# Patient Record
Sex: Female | Born: 1975 | Race: White | Hispanic: No | Marital: Married | Smoking: Never smoker
Health system: Southern US, Community
[De-identification: ages and names within clinical notes are randomized; demographics above are authoritative.]

## PROBLEM LIST (undated history)

## (undated) DIAGNOSIS — R112 Nausea with vomiting, unspecified: Secondary | ICD-10-CM

## (undated) DIAGNOSIS — T7840XA Allergy, unspecified, initial encounter: Secondary | ICD-10-CM

## (undated) DIAGNOSIS — D219 Benign neoplasm of connective and other soft tissue, unspecified: Secondary | ICD-10-CM

## (undated) DIAGNOSIS — N83209 Unspecified ovarian cyst, unspecified side: Secondary | ICD-10-CM

## (undated) DIAGNOSIS — Z87442 Personal history of urinary calculi: Secondary | ICD-10-CM

## (undated) DIAGNOSIS — Z9889 Other specified postprocedural states: Secondary | ICD-10-CM

## (undated) DIAGNOSIS — E785 Hyperlipidemia, unspecified: Secondary | ICD-10-CM

## (undated) DIAGNOSIS — Z8489 Family history of other specified conditions: Secondary | ICD-10-CM

## (undated) DIAGNOSIS — I10 Essential (primary) hypertension: Secondary | ICD-10-CM

## (undated) HISTORY — DX: Allergy, unspecified, initial encounter: T78.40XA

## (undated) HISTORY — PX: HERNIA REPAIR: SHX51

---

## 1997-09-21 ENCOUNTER — Ambulatory Visit (HOSPITAL_COMMUNITY): Admission: RE | Admit: 1997-09-21 | Discharge: 1997-09-21 | Payer: Self-pay | Admitting: Family Medicine

## 1997-12-07 ENCOUNTER — Ambulatory Visit (HOSPITAL_COMMUNITY): Admission: RE | Admit: 1997-12-07 | Discharge: 1997-12-07 | Payer: Self-pay | Admitting: Family Medicine

## 1998-03-07 ENCOUNTER — Inpatient Hospital Stay (HOSPITAL_COMMUNITY): Admission: AD | Admit: 1998-03-07 | Discharge: 1998-03-07 | Payer: Self-pay | Admitting: Family Medicine

## 1998-04-05 ENCOUNTER — Encounter (HOSPITAL_COMMUNITY): Admission: AD | Admit: 1998-04-05 | Discharge: 1998-04-18 | Payer: Self-pay | Admitting: Family Medicine

## 1998-04-16 HISTORY — PX: WISDOM TOOTH EXTRACTION: SHX21

## 1998-04-17 ENCOUNTER — Inpatient Hospital Stay (HOSPITAL_COMMUNITY): Admission: AD | Admit: 1998-04-17 | Discharge: 1998-04-19 | Payer: Self-pay | Admitting: Family Medicine

## 1998-04-29 ENCOUNTER — Inpatient Hospital Stay (HOSPITAL_COMMUNITY): Admission: AD | Admit: 1998-04-29 | Discharge: 1998-04-29 | Payer: Self-pay | Admitting: Family Medicine

## 1998-07-12 ENCOUNTER — Other Ambulatory Visit: Admission: RE | Admit: 1998-07-12 | Discharge: 1998-07-12 | Payer: Self-pay | Admitting: Obstetrics & Gynecology

## 1999-07-31 ENCOUNTER — Other Ambulatory Visit: Admission: RE | Admit: 1999-07-31 | Discharge: 1999-07-31 | Payer: Self-pay | Admitting: Obstetrics & Gynecology

## 2000-11-11 ENCOUNTER — Other Ambulatory Visit: Admission: RE | Admit: 2000-11-11 | Discharge: 2000-11-11 | Payer: Self-pay | Admitting: Obstetrics & Gynecology

## 2001-11-19 ENCOUNTER — Other Ambulatory Visit: Admission: RE | Admit: 2001-11-19 | Discharge: 2001-11-19 | Payer: Self-pay | Admitting: Obstetrics & Gynecology

## 2002-06-12 ENCOUNTER — Inpatient Hospital Stay (HOSPITAL_COMMUNITY): Admission: AD | Admit: 2002-06-12 | Discharge: 2002-06-14 | Payer: Self-pay | Admitting: Obstetrics and Gynecology

## 2002-07-20 ENCOUNTER — Other Ambulatory Visit: Admission: RE | Admit: 2002-07-20 | Discharge: 2002-07-20 | Payer: Self-pay | Admitting: Obstetrics & Gynecology

## 2003-08-18 ENCOUNTER — Other Ambulatory Visit: Admission: RE | Admit: 2003-08-18 | Discharge: 2003-08-18 | Payer: Self-pay | Admitting: Obstetrics & Gynecology

## 2004-10-02 ENCOUNTER — Other Ambulatory Visit: Admission: RE | Admit: 2004-10-02 | Discharge: 2004-10-02 | Payer: Self-pay | Admitting: Obstetrics & Gynecology

## 2016-02-07 ENCOUNTER — Emergency Department (HOSPITAL_COMMUNITY): Payer: BC Managed Care – PPO

## 2016-02-07 ENCOUNTER — Emergency Department (HOSPITAL_COMMUNITY)
Admission: EM | Admit: 2016-02-07 | Discharge: 2016-02-07 | Disposition: A | Discharge status: Home / Self Care | Payer: BC Managed Care – PPO | Attending: Emergency Medicine | Admitting: Emergency Medicine

## 2016-02-07 ENCOUNTER — Encounter (HOSPITAL_COMMUNITY): Payer: Self-pay | Admitting: Emergency Medicine

## 2016-02-07 DIAGNOSIS — R1031 Right lower quadrant pain: Secondary | ICD-10-CM

## 2016-02-07 DIAGNOSIS — N83201 Unspecified ovarian cyst, right side: Secondary | ICD-10-CM | POA: Diagnosis not present

## 2016-02-07 LAB — URINALYSIS, ROUTINE W REFLEX MICROSCOPIC
Bilirubin Urine: NEGATIVE
Glucose, UA: NEGATIVE mg/dL
Ketones, ur: NEGATIVE mg/dL
Nitrite: NEGATIVE
Protein, ur: 30 mg/dL — AB
Specific Gravity, Urine: 1.013 (ref 1.005–1.030)
pH: 6 (ref 5.0–8.0)

## 2016-02-07 LAB — CBC WITH DIFFERENTIAL/PLATELET
Basophils Absolute: 0 K/uL (ref 0.0–0.1)
Basophils Relative: 0 %
Eosinophils Absolute: 0.1 K/uL (ref 0.0–0.7)
Eosinophils Relative: 1 %
HCT: 36.1 % (ref 36.0–46.0)
Hemoglobin: 10.9 g/dL — ABNORMAL LOW (ref 12.0–15.0)
Lymphocytes Relative: 5 %
Lymphs Abs: 0.9 K/uL (ref 0.7–4.0)
MCH: 22.7 pg — ABNORMAL LOW (ref 26.0–34.0)
MCHC: 30.2 g/dL (ref 30.0–36.0)
MCV: 75.1 fL — ABNORMAL LOW (ref 78.0–100.0)
Monocytes Absolute: 0.8 K/uL (ref 0.1–1.0)
Monocytes Relative: 5 %
Neutro Abs: 15.7 K/uL — ABNORMAL HIGH (ref 1.7–7.7)
Neutrophils Relative %: 89 %
Platelets: 329 K/uL (ref 150–400)
RBC: 4.81 MIL/uL (ref 3.87–5.11)
RDW: 15.5 % (ref 11.5–15.5)
WBC: 17.7 K/uL — ABNORMAL HIGH (ref 4.0–10.5)

## 2016-02-07 LAB — URINE MICROSCOPIC-ADD ON

## 2016-02-07 LAB — COMPREHENSIVE METABOLIC PANEL WITH GFR
ALT: 12 U/L — ABNORMAL LOW (ref 14–54)
AST: 17 U/L (ref 15–41)
Albumin: 3.8 g/dL (ref 3.5–5.0)
Alkaline Phosphatase: 84 U/L (ref 38–126)
Anion gap: 10 (ref 5–15)
BUN: 12 mg/dL (ref 6–20)
CO2: 22 mmol/L (ref 22–32)
Calcium: 9 mg/dL (ref 8.9–10.3)
Chloride: 105 mmol/L (ref 101–111)
Creatinine, Ser: 0.75 mg/dL (ref 0.44–1.00)
GFR calc Af Amer: >60 mL/min
GFR calc non Af Amer: >60 mL/min
Glucose, Bld: 102 mg/dL — ABNORMAL HIGH (ref 65–99)
Potassium: 4 mmol/L (ref 3.5–5.1)
Sodium: 137 mmol/L (ref 135–145)
Total Bilirubin: 0.5 mg/dL (ref 0.3–1.2)
Total Protein: 6.8 g/dL (ref 6.5–8.1)

## 2016-02-07 LAB — LIPASE, BLOOD: Lipase: 31 U/L (ref 11–51)

## 2016-02-07 LAB — I-STAT BETA HCG BLOOD, ED (MC, WL, AP ONLY): I-stat hCG, quantitative: <5 m[IU]/mL

## 2016-02-07 MED ORDER — KETOROLAC TROMETHAMINE 30 MG/ML IJ SOLN
30.0000 mg | Freq: Once | INTRAMUSCULAR | Status: AC
  Administered 2016-02-07: 30 mg via INTRAVENOUS
  Filled 2016-02-07: qty 1

## 2016-02-07 MED ORDER — NAPROXEN 500 MG PO TABS: 500.0000 mg | ORAL_TABLET | Freq: Two times a day (BID) | ORAL | 0 refills | Status: DC

## 2016-02-07 MED ORDER — IOPAMIDOL (ISOVUE-300) INJECTION 61%
INTRAVENOUS | Status: AC
  Administered 2016-02-07: 100 mL
  Filled 2016-02-07: qty 100

## 2016-02-07 NOTE — ED Notes (Signed)
Patient transported to CT 

## 2016-02-07 NOTE — ED Triage Notes (Signed)
Pt states yesterday she had abdominal pain in middle of abdomen. Then today, abdominal pain became worse and is now on her Right side. Pt states she was nauseous yesterday. Denies vomiting. Pt states pain was 10/10 before ibuprofen. Pt took 2 ibuprofen at home. Pt states pain now 4 or 5 out of 10.

## 2016-02-07 NOTE — Discharge Instructions (Signed)
If your pain significantly worsens in the next 24 hours or if you develop fevers, chills, vomiting please return to the ED. Otherwise, please follow up with her OB/GYN. Take Naprosyn as needed for pain.

## 2016-02-07 NOTE — ED Provider Notes (Signed)
[facility] DEPT Provider Note   CSN: [id number] Arrival date & time: 02/07/16  [id number]     History   Chief Complaint Chief Complaint  Patient presents with  . Abdominal Pain    HPI Wanda Richardson is a 40 y.o. female with pmhx of obesity who presents to the ED today c/o abdominal pain. Pt states that yesterday she had [place] food for lunch. Approximately 2 hours later she developed a severe cramping sensation around her umbilicius. Pain did not radiate and lasted for about 20 minutes. Pt was then woken up from sleep around 4 am from pain that was now in her suprapubic are and RLQ. She took 2 ibuprofen which milkdly improved her pain. Pain is worsened with walking or any movent. She reports associated clamminess and nausea. Pt went to UC and was sent here to r/o appendicitis.  HPI  History reviewed. No pertinent past medical history.  There are no active problems to display for this patient.   History reviewed. No pertinent surgical history.  OB History    Gravida Para Term Preterm AB Living             2   SAB TAB Ectopic Multiple Live Births                   Home Medications    Prior to Admission medications   Not on File    Family History History reviewed. No pertinent family history.  Social History Social History  Substance Use Topics  . Smoking status: Never Smoker  . Smokeless tobacco: Never Used  . Alcohol use No     Allergies   Review of patient's allergies indicates no known allergies.   Review of Systems Review of Systems  All other systems reviewed and are negative.    Physical Exam Updated Vital Signs BP 146/85 (BP Location: Right Arm)   Pulse 71   Temp 98 F (36.7 C) (Oral)   Resp 16   Ht 5\' 5"  (1.651 m)   Wt 102.1 kg   LMP 02/06/2016 (Exact Date)   SpO2 99%   BMI 37.44 kg/m   Physical Exam  Constitutional: She is oriented to person, place, and time. She appears well-developed and well-nourished. No distress.  HENT:    Head: Normocephalic and atraumatic.  Mouth/Throat: No oropharyngeal exudate.  Eyes: Conjunctivae and EOM are normal. Pupils are equal, round, and reactive to light. Right eye exhibits no discharge. Left eye exhibits no discharge. No scleral icterus.  Cardiovascular: Normal rate, regular rhythm, normal heart sounds and intact distal pulses.  Exam reveals no gallop and no friction rub.   No murmur heard. Pulmonary/Chest: Effort normal and breath sounds normal. No respiratory distress. She has no wheezes. She has no rales. She exhibits no tenderness.  Abdominal: Soft. She exhibits no distension. There is tenderness ( RLQ). There is no guarding.  Negative Rosving's. No peritoneal signs  Musculoskeletal: Normal range of motion. She exhibits no edema.  Neurological: She is alert and oriented to person, place, and time.  Skin: Skin is warm and dry. No rash noted. She is not diaphoretic. No erythema. No pallor.  Psychiatric: She has a normal mood and affect. Her behavior is normal.  Nursing note and vitals reviewed.    ED Treatments / Results  Labs (all labs ordered are listed, but only abnormal results are displayed) Labs Reviewed  COMPREHENSIVE METABOLIC PANEL  CBC WITH DIFFERENTIAL/PLATELET  LIPASE, BLOOD  URINALYSIS, ROUTINE W REFLEX MICROSCOPIC (NOT AT  [facility])  I-STAT BETA HCG BLOOD, ED ([facility], [facility], [facility] ONLY)    EKG  EKG Interpretation None       Radiology US Transvaginal Non-ob  Result Date: 02/07/2016 CLINICAL DATA:  RIGHT upper quadrant pain. RIGHT ovarian cyst on CT. Evaluate for ovarian torsion. EXAM: TRANSABDOMINAL AND TRANSVAGINAL ULTRASOUND OF PELVIS DOPPLER ULTRASOUND OF OVARIES TECHNIQUE: Both transabdominal and transvaginal ultrasound examinations of the pelvis were performed. Transabdominal technique was performed for global imaging of the pelvis including uterus, ovaries, adnexal regions, and pelvic cul-de-sac. It was necessary to proceed with endovaginal exam following the  transabdominal exam to visualize the ovaries. Color and duplex Doppler ultrasound was utilized to evaluate blood flow to the ovaries. COMPARISON:  CT 02/07/2016 FINDINGS: Uterus Measurements: Normal in size at 12.2 x 6.5 x 7.0 cm. 4 cm leiomyoma within the uterine fundus. Endometrium Thickness: Thickened to 16.2 mm. Upper limits of normal for premenopausal female. Right ovary Measurements: LEFT ovary is enlarged to 7.1 x 6.2 x 6.5 cm. Complex cystic lesion measuring 6.5 x 3.6 x 7.0 cm within the RIGHT ovary with mild internal septations. This is poorly evaluated due to body habitus . Left ovary Measurements: 3.6 x 2.8 x 3.2 cm. Normal appearance / no adnexal mass. Pulsed Doppler evaluation of both ovaries demonstrates normal low-resistance arterial and venous waveforms. Other findings No abnormal free fluid. IMPRESSION: 1. No evidence of ovarian torsion. 2. Complex cystic ovarian lesion on the RIGHT. Recommend GYN consultation and follow-up ultrasound in 6 to 8 weeks. 3. Uterine fibroid. 4. Endometrium upper limits of normal. Recommend evaluation in 6-8 weeks with the RIGHT ovarian cyst. Electronically Signed   By: Suzy Bouchard M.D.   On: 02/07/2016 15:11   US Pelvis Complete  Result Date: 02/07/2016 CLINICAL DATA:  RIGHT upper quadrant pain. RIGHT ovarian cyst on CT. Evaluate for ovarian torsion. EXAM: TRANSABDOMINAL AND TRANSVAGINAL ULTRASOUND OF PELVIS DOPPLER ULTRASOUND OF OVARIES TECHNIQUE: Both transabdominal and transvaginal ultrasound examinations of the pelvis were performed. Transabdominal technique was performed for global imaging of the pelvis including uterus, ovaries, adnexal regions, and pelvic cul-de-sac. It was necessary to proceed with endovaginal exam following the transabdominal exam to visualize the ovaries. Color and duplex Doppler ultrasound was utilized to evaluate blood flow to the ovaries. COMPARISON:  CT 02/07/2016 FINDINGS: Uterus Measurements: Normal in size at 12.2 x 6.5 x 7.0  cm. 4 cm leiomyoma within the uterine fundus. Endometrium Thickness: Thickened to 16.2 mm. Upper limits of normal for premenopausal female. Right ovary Measurements: LEFT ovary is enlarged to 7.1 x 6.2 x 6.5 cm. Complex cystic lesion measuring 6.5 x 3.6 x 7.0 cm within the RIGHT ovary with mild internal septations. This is poorly evaluated due to body habitus . Left ovary Measurements: 3.6 x 2.8 x 3.2 cm. Normal appearance / no adnexal mass. Pulsed Doppler evaluation of both ovaries demonstrates normal low-resistance arterial and venous waveforms. Other findings No abnormal free fluid. IMPRESSION: 1. No evidence of ovarian torsion. 2. Complex cystic ovarian lesion on the RIGHT. Recommend GYN consultation and follow-up ultrasound in 6 to 8 weeks. 3. Uterine fibroid. 4. Endometrium upper limits of normal. Recommend evaluation in 6-8 weeks with the RIGHT ovarian cyst. Electronically Signed   By: Suzy Bouchard M.D.   On: 02/07/2016 15:11   Ct Abdomen Pelvis W Contrast  Result Date: 02/07/2016 CLINICAL DATA:  Right abdominal pain, nausea starting yesterday, right lower quadrant pain EXAM: CT ABDOMEN AND PELVIS WITH CONTRAST TECHNIQUE: Multidetector CT imaging of the abdomen and pelvis  was performed using the standard protocol following bolus administration of intravenous contrast. CONTRAST:  144mL ISOVUE-300 IOPAMIDOL (ISOVUE-300) INJECTION 61% COMPARISON:  None. FINDINGS: Lower chest: Lung bases are unremarkable. Hepatobiliary: Enhanced liver is unremarkable. No calcified gallstones are noted within gallbladder. Pancreas: Enhanced pancreas is unremarkable. Spleen: Enhanced spleen is unremarkable. Adrenals/Urinary Tract: No adrenal gland mass. Enhanced kidneys are symmetrical in size. No hydronephrosis or hydroureter. Delayed renal images shows bilateral renal symmetrical excretion. Bilateral visualized proximal ureter is unremarkable. The urinary bladder is unremarkable. Stomach/Bowel: Study is limited without  oral contrast. No gastric outlet obstruction. No small bowel obstruction. No pericecal inflammation. The appendix is not identified. No colitis or diverticulitis. No distal colonic obstruction. Vascular/Lymphatic: No aortic aneurysm. No retroperitoneal or mesenteric adenopathy. Reproductive: There is a right ovarian cyst with a tiny septation measures 8.1 x 5 cm. Correlation with GYN exam and follow-up pelvic ultrasound in 6-8 weeks is recommended to assure resolution There is a enhancing myometrial nodule in right fundus measures 3.8 cm highly suspicious for a fibroid. Correlation with pelvic ultrasound or MRI is recommended. The left ovary is unremarkable. Other: There is a umbilical hernia containing omental fat and small amount of fluid measures 3.7 cm. There is mild stranding of the mesenteric fat in right anterior mesentery at the base of the hernia measures about 1.7 cm. Focal panniculitis cannot be excluded. Clinical correlation is necessary. Musculoskeletal: No destructive bony lesions are noted. Sagittal images of the spine are unremarkable. IMPRESSION: 1. There is a right ovarian cyst with a thin septation measures 8.1 x 5 cm. Correlation with GYN exam and follow-up pelvic ultrasound in 6-8 weeks is recommended to assure resolution. Enhancing probable fibroid within right uterus fundus measures 3.8 cm. Correlation with pelvic ultrasound or MRI is recommended. 2. No pericecal inflammation.  Appendix is not identified. 3. No small bowel obstruction. There is a umbilical hernia containing omental fat and small amount of fluid measures 3.7 cm. There is mild stranding of the mesenteric fat in right anterior mesentery at the base of the hernia measures about 1.7 cm. Focal panniculitis cannot be excluded. Clinical correlation is necessary. Electronically Signed   By: Lahoma Crocker M.D.   On: 02/07/2016 11:37   [place] Art/ven Flow Abd Pelv Doppler  Result Date: 02/07/2016 CLINICAL DATA:  RIGHT upper quadrant pain.  RIGHT ovarian cyst on CT. Evaluate for ovarian torsion. EXAM: TRANSABDOMINAL AND TRANSVAGINAL ULTRASOUND OF PELVIS DOPPLER ULTRASOUND OF OVARIES TECHNIQUE: Both transabdominal and transvaginal ultrasound examinations of the pelvis were performed. Transabdominal technique was performed for global imaging of the pelvis including uterus, ovaries, adnexal regions, and pelvic cul-de-sac. It was necessary to proceed with endovaginal exam following the transabdominal exam to visualize the ovaries. Color and duplex Doppler ultrasound was utilized to evaluate blood flow to the ovaries. COMPARISON:  CT 02/07/2016 FINDINGS: Uterus Measurements: Normal in size at 12.2 x 6.5 x 7.0 cm. 4 cm leiomyoma within the uterine fundus. Endometrium Thickness: Thickened to 16.2 mm. Upper limits of normal for premenopausal female. Right ovary Measurements: LEFT ovary is enlarged to 7.1 x 6.2 x 6.5 cm. Complex cystic lesion measuring 6.5 x 3.6 x 7.0 cm within the RIGHT ovary with mild internal septations. This is poorly evaluated due to body habitus . Left ovary Measurements: 3.6 x 2.8 x 3.2 cm. Normal appearance / no adnexal mass. Pulsed Doppler evaluation of both ovaries demonstrates normal low-resistance arterial and venous waveforms. Other findings No abnormal free fluid. IMPRESSION: 1. No evidence of ovarian torsion. 2. Complex cystic  ovarian lesion on the RIGHT. Recommend GYN consultation and follow-up ultrasound in 6 to 8 weeks. 3. Uterine fibroid. 4. Endometrium upper limits of normal. Recommend evaluation in 6-8 weeks with the RIGHT ovarian cyst. Electronically Signed   By: Suzy Bouchard M.D.   On: 02/07/2016 15:11    Procedures Procedures (including critical care time)  Medications Ordered in ED Medications  ketorolac (TORADOL) 30 MG/ML injection 30 mg (not administered)     Initial Impression / Assessment and Plan / ED Course  I have reviewed the triage vital signs and the nursing notes.  Pertinent labs & imaging  results that were available during my care of the patient were reviewed by me and considered in my medical decision making (see chart for details).  Clinical Course    40 year old female with no significant past medical history presents to the ED today complaining of right lower quadrant abdominal pain worsening since yesterday. On presentation to ED patient overall appears well, is in no apparent distress. Vitals are stable. Patient afebrile. She does have tenderness in her right lower quadrant. Abdomen is soft, no peritoneal signs. Patient was sent from urgent care to rule out appendicitis. Gross ecchymosis present, white blood cell 17.7. Pain adequately managed with Toradol. CT was unable to visualize the appendix. There was no pericecal inflammation. Patient does have a large right ovarian cyst which is likely what is causing patient's symptoms. Pelvic ultrasound obtained to rule out ovarian torsion which was negative for this. Patient is currently menstruating which again favors ovarian cyst as source of pain. When discussing results with patient she revealed that she occasionally gets mild right lower quadrant pain associated with her cycle each month for the last year or 2. This time is much more significant however. UA does reveal moderate leukocytes and 6-30 WBC. Patient does not have any urinary symptoms. We'll send for culture and defer treatment at this time. Patient was instructed to return to the emergency Department immediately if she experienced significantly worsening of her abdominal pain, 4 hours or she exhibits fevers or vomiting as appendicitis was not completely excluded today although unlikely  Given history and exam. Pt will otherwise follow up with OBGYN. Return precautions outlined in patient discharge instructions.   Case discussed with Dr. Sabra Heck who agrees with treatment plan.  Final Clinical Impressions(s) / ED Diagnoses   Final diagnoses:  Right lower quadrant abdominal  pain  Cyst of right ovary    New Prescriptions New Prescriptions   No medications on file     Carlos Levering, PA-C 02/07/16 1617    Noemi Chapel, MD 02/07/16 2041

## 2016-02-07 NOTE — ED Notes (Signed)
Pt. Returned from ultrasound at this time

## 2016-02-08 LAB — URINE CULTURE

## 2016-06-18 ENCOUNTER — Encounter (HOSPITAL_COMMUNITY): Payer: Self-pay | Admitting: *Deleted

## 2016-06-18 ENCOUNTER — Emergency Department (HOSPITAL_COMMUNITY)
Admission: EM | Admit: 2016-06-18 | Discharge: 2016-06-18 | Disposition: A | Discharge status: Home / Self Care | Payer: BC Managed Care – PPO | Attending: Emergency Medicine | Admitting: Emergency Medicine

## 2016-06-18 ENCOUNTER — Emergency Department (HOSPITAL_COMMUNITY): Payer: BC Managed Care – PPO

## 2016-06-18 DIAGNOSIS — R1031 Right lower quadrant pain: Secondary | ICD-10-CM

## 2016-06-18 DIAGNOSIS — Z79899 Other long term (current) drug therapy: Secondary | ICD-10-CM | POA: Insufficient documentation

## 2016-06-18 DIAGNOSIS — N200 Calculus of kidney: Secondary | ICD-10-CM | POA: Insufficient documentation

## 2016-06-18 DIAGNOSIS — N3001 Acute cystitis with hematuria: Secondary | ICD-10-CM | POA: Insufficient documentation

## 2016-06-18 HISTORY — DX: Unspecified ovarian cyst, unspecified side: N83.209

## 2016-06-18 HISTORY — DX: Benign neoplasm of connective and other soft tissue, unspecified: D21.9

## 2016-06-18 LAB — CBC WITH DIFFERENTIAL/PLATELET
Basophils Absolute: 0 10*3/uL (ref 0.0–0.1)
Basophils Relative: 0 %
EOS ABS: 0.2 10*3/uL (ref 0.0–0.7)
EOS PCT: 1 %
HCT: 34.6 % — ABNORMAL LOW (ref 36.0–46.0)
Hemoglobin: 10.6 g/dL — ABNORMAL LOW (ref 12.0–15.0)
LYMPHS ABS: 1 10*3/uL (ref 0.7–4.0)
LYMPHS PCT: 6 %
MCH: 23.1 pg — ABNORMAL LOW (ref 26.0–34.0)
MCHC: 30.6 g/dL (ref 30.0–36.0)
MCV: 75.5 fL — AB (ref 78.0–100.0)
MONO ABS: 0.8 10*3/uL (ref 0.1–1.0)
MONOS PCT: 4 %
Neutro Abs: 15.8 10*3/uL — ABNORMAL HIGH (ref 1.7–7.7)
Neutrophils Relative %: 89 %
PLATELETS: 329 10*3/uL (ref 150–400)
RBC: 4.58 MIL/uL (ref 3.87–5.11)
RDW: 15.5 % (ref 11.5–15.5)
WBC: 17.8 10*3/uL — ABNORMAL HIGH (ref 4.0–10.5)

## 2016-06-18 LAB — COMPREHENSIVE METABOLIC PANEL
ALT: 11 U/L — AB (ref 14–54)
ANION GAP: 8 (ref 5–15)
AST: 16 U/L (ref 15–41)
Albumin: 3.6 g/dL (ref 3.5–5.0)
Alkaline Phosphatase: 79 U/L (ref 38–126)
BUN: 11 mg/dL (ref 6–20)
CHLORIDE: 106 mmol/L (ref 101–111)
CO2: 24 mmol/L (ref 22–32)
CREATININE: 0.74 mg/dL (ref 0.44–1.00)
Calcium: 9 mg/dL (ref 8.9–10.3)
Glucose, Bld: 132 mg/dL — ABNORMAL HIGH (ref 65–99)
POTASSIUM: 3.6 mmol/L (ref 3.5–5.1)
SODIUM: 138 mmol/L (ref 135–145)
Total Bilirubin: 0.6 mg/dL (ref 0.3–1.2)
Total Protein: 6.8 g/dL (ref 6.5–8.1)

## 2016-06-18 LAB — URINALYSIS, ROUTINE W REFLEX MICROSCOPIC
BILIRUBIN URINE: NEGATIVE
Glucose, UA: NEGATIVE mg/dL
KETONES UR: NEGATIVE mg/dL
NITRITE: NEGATIVE
PH: 5 (ref 5.0–8.0)
Protein, ur: NEGATIVE mg/dL
SPECIFIC GRAVITY, URINE: 1.02 (ref 1.005–1.030)

## 2016-06-18 LAB — LIPASE, BLOOD: LIPASE: 26 U/L (ref 11–51)

## 2016-06-18 LAB — POC URINE PREG, ED: Preg Test, Ur: NEGATIVE

## 2016-06-18 MED ORDER — SODIUM CHLORIDE 0.9 % IV BOLUS (SEPSIS)
1000.0000 mL | Freq: Once | INTRAVENOUS | Status: AC
  Administered 2016-06-18: 1000 mL via INTRAVENOUS

## 2016-06-18 MED ORDER — CEPHALEXIN 500 MG PO CAPS: 500.0000 mg | ORAL_CAPSULE | Freq: Two times a day (BID) | ORAL | 0 refills | Status: DC

## 2016-06-18 NOTE — ED Triage Notes (Signed)
Pt c/o R sided lower abdominal pain radiating across abdomen that woke pt from sleep at 0200. Pt took ibuprofen without relief. Attempted to lay in the recliner, but unable to lie flat due to increased pain, took a 4th ibuprofen with slight improvement of pain. Also endorses nausea, no vomiting

## 2016-06-18 NOTE — ED Provider Notes (Signed)
[facility] DEPT Provider Note   CSN: [id number] Arrival date & time: 06/18/16  0421     History   Chief Complaint Chief Complaint  Patient presents with  . Wanda Richardson    HPI Wanda Richardson is a 41 y.o. female PMH of fibroids and ovarian cyst, presenting tonight with sudden onset R side and RLQ Wanda Richardson. Patient states this woke her out of her sleep.  It is radiating down toward her pelvis. She has nausea and no vomiting. She denies feeling ill recently, diarrhea, fevers, urinary or vaginal symptoms.  She states this Richardson is different than the last time she was here with Richardson from her cysts.  This Richardson was "worse than labor".  She took ibuprofen 800mg  which resolved the Richardson to a tolerable level. She has no further complaints.  10 Systems reviewed and are negative for acute change except as noted in the HPI.   HPI  Past Medical History:  Diagnosis Date  . Fibroid   . Ovarian cyst     There are no active problems to display for this patient.   History reviewed. No pertinent surgical history.  OB History    Gravida Para Term Preterm AB Living             2   SAB TAB Ectopic Multiple Live Births                   Home Medications    Prior to Admission medications   Medication Sig Start Date End Date Taking? Authorizing Provider  cephALEXin (KEFLEX) 500 MG capsule Take 1 capsule (500 mg total) by mouth 2 (two) times daily. 06/18/16   Everlene Balls, MD  fluticasone (FLONASE) 50 MCG/ACT nasal spray Place 1 spray into both nostrils daily as needed for allergies or rhinitis.    Historical Provider, MD  ibuprofen (ADVIL,MOTRIN) 200 MG tablet Take 200 mg by mouth every 6 (six) hours as needed for moderate Richardson.    Historical Provider, MD  naproxen (NAPROSYN) 500 MG tablet Take 1 tablet (500 mg total) by mouth 2 (two) times daily. 02/07/16   Samantha Tripp Dowless, PA-C    Family History No family history on file.  Social History Social History  Substance Use  Topics  . Smoking status: Never Smoker  . Smokeless tobacco: Never Used  . Alcohol use No     Allergies   Patient has no known allergies.   Review of Systems Review of Systems   Physical Exam Updated Vital Signs BP 129/60 (BP Location: Left Arm)   Pulse 85   Temp 98.9 F (37.2 C) (Oral)   Resp 14   LMP 05/28/2016   SpO2 99%   Physical Exam  Constitutional: She is oriented to person, place, and time. She appears well-developed and well-nourished. No distress.  HENT:  Head: Normocephalic and atraumatic.  Nose: Nose normal.  Mouth/Throat: Oropharynx is clear and moist. No oropharyngeal exudate.  Eyes: Conjunctivae and EOM are normal. Pupils are equal, round, and reactive to light. No scleral icterus.  Neck: Normal range of motion. Neck supple. No JVD present. No tracheal deviation present. No thyromegaly present.  Cardiovascular: Normal rate, regular rhythm and normal heart sounds.  Exam reveals no gallop and no friction rub.   No murmur heard. Pulmonary/Chest: Effort normal and breath sounds normal. No respiratory distress. She has no wheezes. She exhibits no tenderness.  Wanda: Soft. Bowel sounds are normal. She exhibits no distension and no mass. There is tenderness.  There is rebound. There is no guarding.  RLQ TTP  Musculoskeletal: Normal range of motion. She exhibits no edema or tenderness.  Lymphadenopathy:    She has no cervical adenopathy.  Neurological: She is alert and oriented to person, place, and time. No cranial nerve deficit. She exhibits normal muscle tone.  Skin: Skin is warm and dry. No rash noted. No erythema. No pallor.  Nursing note and vitals reviewed.    ED Treatments / Results  Labs (all labs ordered are listed, but only abnormal results are displayed) Labs Reviewed  URINALYSIS, ROUTINE W REFLEX MICROSCOPIC - Abnormal; Notable for the following:       Result Value   APPearance CLOUDY (*)    Hgb urine dipstick LARGE (*)    Leukocytes, UA  MODERATE (*)    Bacteria, UA RARE (*)    Squamous Epithelial / LPF 6-30 (*)    All other components within normal limits  CBC WITH DIFFERENTIAL/PLATELET - Abnormal; Notable for the following:    WBC 17.8 (*)    Hemoglobin 10.6 (*)    HCT 34.6 (*)    MCV 75.5 (*)    MCH 23.1 (*)    Neutro Abs 15.8 (*)    All other components within normal limits  COMPREHENSIVE METABOLIC PANEL - Abnormal; Notable for the following:    Glucose, Bld 132 (*)    ALT 11 (*)    All other components within normal limits  LIPASE, BLOOD  POC URINE PREG, ED    EKG  EKG Interpretation None       Radiology Ct Renal Stone Study  Result Date: 06/18/2016 CLINICAL DATA:  Right lower quadrant Wanda Richardson. EXAM: CT ABDOMEN AND PELVIS WITHOUT CONTRAST TECHNIQUE: Multidetector CT imaging of the abdomen and pelvis was performed following the standard protocol without IV contrast. COMPARISON:  02/07/2016 FINDINGS: Lower chest: No acute abnormality. Hepatobiliary: No focal liver abnormality is seen. No gallstones, gallbladder wall thickening, or biliary dilatation. Pancreas: Unremarkable. No pancreatic ductal dilatation or surrounding inflammatory changes. Spleen: Normal in size without focal abnormality. Adrenals/Urinary Tract: Adrenal glands are unremarkable. Kidneys are normal, without renal calculi, focal lesion, or hydronephrosis. Bladder is unremarkable. Stomach/Bowel: Stomach, small bowel and colon are unremarkable. The appendix is not discretely visible but no acute inflammatory changes are evident around the cecal tip. Vascular/Lymphatic: No significant vascular findings are present. No enlarged Wanda or pelvic lymph nodes. Reproductive: 5 x 7 cm right lower quadrant cyst appears unchanged from 02/07/2016. Uterus and left ovary are unremarkable. Other: Umbilical hernia with small amount of fluid or thickening, unchanged. No ascites. No focal acute inflammation. Musculoskeletal: No significant skeletal lesion.  IMPRESSION: No acute inflammatory changes are evident. There is an unchanged 5 x 7 cm right ovarian cyst an unchanged umbilical hernia. Electronically Signed   By: Andreas Newport M.D.   On: 06/18/2016 06:56    Procedures Procedures (including critical care time)  Medications Ordered in ED Medications  sodium chloride 0.9 % bolus 1,000 mL (0 mLs Intravenous Stopped 06/18/16 0559)     Initial Impression / Assessment and Plan / ED Course  I have reviewed the triage vital signs and the nursing notes.  Pertinent labs & imaging results that were available during my care of the patient were reviewed by me and considered in my medical decision making (see chart for details).     Patient presents to the ED for Wanda Richardson. History is concerning for nephrolithiasis, she denies any history of this.  Currently she  wants no medications.  Will obtain labs, urine and CT for renal stone.    CT neg althought patient likely passed stone as her Richardson improved.  She has microscopic hematuria.  Will give urology fu and keflex for possible uti.  PAtient is safe for DC.  Final Clinical Impressions(s) / ED Diagnoses   Final diagnoses:  RLQ Wanda Richardson  Nephrolithiasis  Acute cystitis with hematuria    New Prescriptions Discharge Medication List as of 06/18/2016  6:59 AM    START taking these medications   Details  cephALEXin (KEFLEX) 500 MG capsule Take 1 capsule (500 mg total) by mouth 2 (two) times daily., Starting Mon 06/18/2016, Print         Everlene Balls, MD 06/18/16 [phone]

## 2016-06-18 NOTE — ED Notes (Signed)
Pt departed in NAD, refused use of wheelchair.  

## 2016-06-18 NOTE — ED Notes (Signed)
Patient transported to CT 

## 2017-06-14 ENCOUNTER — Other Ambulatory Visit: Payer: Self-pay

## 2017-06-14 ENCOUNTER — Emergency Department (HOSPITAL_COMMUNITY)
Admission: EM | Admit: 2017-06-14 | Discharge: 2017-06-14 | Disposition: A | Discharge status: Home / Self Care | Payer: BC Managed Care – PPO | Attending: Emergency Medicine | Admitting: Emergency Medicine

## 2017-06-14 ENCOUNTER — Encounter (HOSPITAL_COMMUNITY): Payer: Self-pay

## 2017-06-14 ENCOUNTER — Emergency Department (HOSPITAL_COMMUNITY): Payer: BC Managed Care – PPO

## 2017-06-14 DIAGNOSIS — Z79899 Other long term (current) drug therapy: Secondary | ICD-10-CM | POA: Insufficient documentation

## 2017-06-14 DIAGNOSIS — K420 Umbilical hernia with obstruction, without gangrene: Secondary | ICD-10-CM | POA: Diagnosis not present

## 2017-06-14 DIAGNOSIS — R1033 Periumbilical pain: Secondary | ICD-10-CM | POA: Diagnosis present

## 2017-06-14 LAB — COMPREHENSIVE METABOLIC PANEL
ALT: 11 U/L — ABNORMAL LOW (ref 14–54)
AST: 13 U/L — AB (ref 15–41)
Albumin: 2.9 g/dL — ABNORMAL LOW (ref 3.5–5.0)
Alkaline Phosphatase: 76 U/L (ref 38–126)
Anion gap: 11 (ref 5–15)
BUN: 11 mg/dL (ref 6–20)
CO2: 24 mmol/L (ref 22–32)
Calcium: 8.8 mg/dL — ABNORMAL LOW (ref 8.9–10.3)
Chloride: 104 mmol/L (ref 101–111)
Creatinine, Ser: 0.73 mg/dL (ref 0.44–1.00)
Glucose, Bld: 111 mg/dL — ABNORMAL HIGH (ref 65–99)
POTASSIUM: 3.6 mmol/L (ref 3.5–5.1)
Sodium: 139 mmol/L (ref 135–145)
TOTAL PROTEIN: 6.8 g/dL (ref 6.5–8.1)
Total Bilirubin: 0.5 mg/dL (ref 0.3–1.2)

## 2017-06-14 LAB — CBC WITH DIFFERENTIAL/PLATELET
BASOS ABS: 0 10*3/uL (ref 0.0–0.1)
Basophils Relative: 0 %
Eosinophils Absolute: 0.1 10*3/uL (ref 0.0–0.7)
Eosinophils Relative: 1 %
HEMATOCRIT: 33.4 % — AB (ref 36.0–46.0)
Hemoglobin: 10.5 g/dL — ABNORMAL LOW (ref 12.0–15.0)
LYMPHS ABS: 1.6 10*3/uL (ref 0.7–4.0)
LYMPHS PCT: 15 %
MCH: 25.1 pg — AB (ref 26.0–34.0)
MCHC: 31.4 g/dL (ref 30.0–36.0)
MCV: 79.9 fL (ref 78.0–100.0)
MONO ABS: 0.7 10*3/uL (ref 0.1–1.0)
MONOS PCT: 7 %
NEUTROS ABS: 8.1 10*3/uL — AB (ref 1.7–7.7)
Neutrophils Relative %: 77 %
Platelets: 371 10*3/uL (ref 150–400)
RBC: 4.18 MIL/uL (ref 3.87–5.11)
RDW: 15.2 % (ref 11.5–15.5)
WBC: 10.5 10*3/uL (ref 4.0–10.5)

## 2017-06-14 LAB — LIPASE, BLOOD: LIPASE: 31 U/L (ref 11–51)

## 2017-06-14 LAB — HCG, QUANTITATIVE, PREGNANCY: hCG, Beta Chain, Quant, S: 2 m[IU]/mL (ref <5)

## 2017-06-14 MED ORDER — HYDROCODONE-ACETAMINOPHEN 5-325 MG PO TABS
2.0000 | ORAL_TABLET | Freq: Once | ORAL | Status: AC
  Administered 2017-06-14: 2 via ORAL
  Filled 2017-06-14: qty 2

## 2017-06-14 MED ORDER — IBUPROFEN 600 MG PO TABS: 600.0000 mg | ORAL_TABLET | Freq: Four times a day (QID) | ORAL | 0 refills | Status: DC | PRN

## 2017-06-14 MED ORDER — IOPAMIDOL (ISOVUE-300) INJECTION 61%
INTRAVENOUS | Status: AC
  Administered 2017-06-14: 100 mL
  Filled 2017-06-14: qty 100

## 2017-06-14 MED ORDER — HYDROCODONE-ACETAMINOPHEN 5-325 MG PO TABS: 1.0000 | ORAL_TABLET | Freq: Four times a day (QID) | ORAL | 0 refills | Status: DC | PRN

## 2017-06-14 NOTE — ED Notes (Signed)
CT scan notified that pt has an IV.

## 2017-06-14 NOTE — ED Triage Notes (Signed)
Pt presents to the ed with complaints of abdominal pain and redness to lower abdomen. She was seen at urgent care today and they attempted to lance it but nothing came out so he sent her here for further work up.

## 2017-06-14 NOTE — Discharge Instructions (Signed)
Your found to have an umbilical hernia today which contains fat.  There is no evidence on your CT scan that your hernia contains bowel or intestines.  Your blood work today is reassuring.  Your symptoms will likely require surgical management.  You have been given a referral to [facility] for this reason.  Call the office on Monday morning to schedule follow-up.  You have been prescribed medication for pain.  You may take this as needed.  Return to the emergency department if you develop worsening symptoms such as fever, severe worsening of your abdominal pain, vomiting, or any other concerning symptoms.

## 2017-06-14 NOTE — ED Provider Notes (Signed)
[facility] EMERGENCY DEPARTMENT Provider Note   CSN: [id number] Arrival date & time: 06/14/17  1810    History   Chief Complaint Chief Complaint  Patient presents with  . Abdominal Pain    HPI Wanda Richardson is a 42 y.o. female.  42 year old female with no significant past medical history presents to the emergency department for evaluation of persistent abdominal pain.  She notes pain around her umbilicus which has been gradually worsening over the last week.  It severely worsened today.  She describes the pain as burning.  Ibuprofen has been providing little relief of discomfort.  She also noticed redness to her abdomen this morning.  She went to urgent care who thought she may have an abscess.  They attempted I&D, but had no drainage - at which time they sent the patient to the emergency department for evaluation.  She was seen in triage where her workup was initiated.  She reports increased discomfort since the lidocaine from prior I&D has worn off.      Past Medical History:  Diagnosis Date  . Fibroid   . Ovarian cyst     There are no active problems to display for this patient.   History reviewed. No pertinent surgical history.  OB History    Gravida Para Term Preterm AB Living             2   SAB TAB Ectopic Multiple Live Births                   Home Medications    Prior to Admission medications   Medication Sig Start Date End Date Taking? Authorizing Provider  benzonatate (TESSALON) 100 MG capsule Take 200 mg by mouth 3 (three) times daily as needed. 06/06/17  Yes [provider]  ipratropium (ATROVENT) 0.06 % nasal spray Place 2 sprays into both nostrils 3 (three) times daily as needed. 06/06/17  Yes [provider]  norethindrone-ethinyl estradiol (JUNEL FE,GILDESS FE,LOESTRIN FE) 1-20 MG-MCG tablet Take 1 tablet by mouth at bedtime. 04/25/17  Yes [provider]  HYDROcodone-acetaminophen (NORCO/VICODIN) 5-325 MG  tablet Take 1-2 tablets by mouth every 6 (six) hours as needed for severe pain. 06/14/17   Antonietta Breach, PA-C  ibuprofen (ADVIL,MOTRIN) 600 MG tablet Take 1 tablet (600 mg total) by mouth every 6 (six) hours as needed. 06/14/17   Antonietta Breach, PA-C  naproxen (NAPROSYN) 500 MG tablet Take 1 tablet (500 mg total) by mouth 2 (two) times daily. Patient not taking: Reported on 06/14/2017 02/07/16   Dowless, Dondra Spry, PA-C    Family History No family history on file.  Social History Social History   Tobacco Use  . Smoking status: Never Smoker  . Smokeless tobacco: Never Used  Substance Use Topics  . Alcohol use: No  . Drug use: No     Allergies   Patient has no known allergies.   Review of Systems Review of Systems Ten systems reviewed and are negative for acute change, except as noted in the HPI.    Physical Exam Updated Vital Signs BP (!) 146/80   Pulse 84   Temp 98 F (36.7 C)   Resp 15   Wt 102.1 kg (225 lb)   SpO2 98%   BMI 37.44 kg/m   Physical Exam  Constitutional: She is oriented to person, place, and time. She appears well-developed and well-nourished. No distress.  Nontoxic appearing and in NAD  HENT:  Head: Normocephalic and atraumatic.  Eyes: Conjunctivae and EOM are normal. No scleral icterus.  Neck: Normal range of motion.  Cardiovascular: Normal rate, regular rhythm and intact distal pulses.  Pulmonary/Chest: Effort normal. No stridor. No respiratory distress. She has no wheezes.  Respirations even and unlabored  Abdominal:  Erythema and ecchymosis as well as induration to the umbilicus with associated tenderness to palpation. Incision site noted from prior attempted I&D at [facility]. Abdomen is otherwise soft, obese. See below for imaging.  Musculoskeletal: Normal range of motion.  Neurological: She is alert and oriented to person, place, and time. She exhibits normal muscle tone. Coordination normal.  GCS 15. Patient moving all extremities.    Skin: Skin is warm and dry. No rash noted. She is not diaphoretic. No erythema. No pallor.  Psychiatric: She has a normal mood and affect. Her behavior is normal.  Nursing note and vitals reviewed.        ED Treatments / Results  Labs (all labs ordered are listed, but only abnormal results are displayed) Labs Reviewed  CBC WITH DIFFERENTIAL/PLATELET - Abnormal; Notable for the following components:      Result Value   Hemoglobin 10.5 (*)    HCT 33.4 (*)    MCH 25.1 (*)    Neutro Abs 8.1 (*)    All other components within normal limits  COMPREHENSIVE METABOLIC PANEL - Abnormal; Notable for the following components:   Glucose, Bld 111 (*)    Calcium 8.8 (*)    Albumin 2.9 (*)    AST 13 (*)    ALT 11 (*)    All other components within normal limits  LIPASE, BLOOD  HCG, QUANTITATIVE, PREGNANCY    EKG  EKG Interpretation None       Radiology Ct Abdomen Pelvis W Contrast  Result Date: 06/14/2017 CLINICAL DATA:  Abdominal pain for 1 week. Redness to lower abdomen. EXAM: CT ABDOMEN AND PELVIS WITH CONTRAST TECHNIQUE: Multidetector CT imaging of the abdomen and pelvis was performed using the standard protocol following bolus administration of intravenous contrast. CONTRAST:  19mL ISOVUE-300 IOPAMIDOL (ISOVUE-300) INJECTION 61% COMPARISON:  CT 06/18/2016.  Pelvic MRI 02/08/2017 FINDINGS: Lower chest: The lung bases are clear. Hepatobiliary: No focal liver abnormality is seen. No gallstones, gallbladder wall thickening, or biliary dilatation. Pancreas: No ductal dilatation or inflammation. Spleen: Normal in size without focal abnormality. Adrenals/Urinary Tract: Normal adrenal glands. No hydronephrosis or perinephric edema. Homogeneous renal enhancement with symmetric excretion on delayed phase imaging. Urinary bladder is physiologically distended without wall thickening. Stomach/Bowel: Stomach is within normal limits. Appendix is not visualized, no right lower quadrant inflammation.  No evidence of bowel wall thickening, distention, or inflammatory changes. No bowel involvement of the umbilical hernia. Vascular/Lymphatic: No significant vascular findings are present. No enlarged abdominal or pelvic lymph nodes. Reproductive: Right adnexal lesion measures 6.7 cm, previously 7.3 cm, previously characterized on MRI is endometrioma. 3 cm lesion in the left ovary appears similar to prior exams. Uterine fibroids again seen. No significant change from prior. Other: Umbilical hernia contains omental fat with stranding of the fat within and just deep to the hernia neck. Hernia neck measures 3.8 x 2.6 cm in transverse by craniocaudal dimension. Small amount of complex free fluid in the hernia sac. Soft tissue edema of the surrounding subcutaneous tissues. No bowel involvement. No intra-abdominal ascites. No free air. Musculoskeletal: There are no acute or suspicious osseous abnormalities. IMPRESSION: 1. Umbilical hernia contains omental fat with inflammatory change and small amount of complex free fluid, concerning for  fat necrosis/incarceration. No bowel involvement. 2. Bilateral adnexal lesions previously characterized on MRI as endometriomas, unchanged from prior exams. Uterine fibroids again seen. Electronically Signed   By: Jeb Levering M.D.   On: 06/14/2017 21:27    Procedures Procedures (including critical care time)  Medications Ordered in ED Medications  iopamidol (ISOVUE-300) 61 % injection (100 mLs  Contrast Given 06/14/17 2101)  HYDROcodone-acetaminophen (NORCO/VICODIN) 5-325 MG per tablet 2 tablet (2 tablets Oral Given 06/14/17 2308)     Initial Impression / Assessment and Plan / ED Course  I have reviewed the triage vital signs and the nursing notes.  Pertinent labs & imaging results that were available during my care of the patient were reviewed by me and considered in my medical decision making (see chart for details).     43 year old female presents to the emergency  department for complaints of umbilical pain.  Symptoms have been worsening over the last week, specifically the last 24 hours.  She has some erythema and ecchymosis around the umbilicus with associated induration.  This correlates with evidence of an incarcerated hernia on CT scan containing fat.  No evidence of bowel containment on CT scan.  No fever or leukocytosis.  Erythema appreciated to be 2/2 local inflammation.  Anemia is at baseline.   Plan for close outpatient surgical follow up. Patient provided referral with medications for pain control.  I did reach out to [facility], Dr. Grandville Silos, via Epic to inform him of patient's need for follow up.  Strict return precautions discussed and provided.  Return precautions discussed and provided. Patient discharged in stable condition with no unaddressed concerns.  Vitals:   06/14/17 2027 06/14/17 2231 06/14/17 2232 06/14/17 2300  BP: (!) 150/72 (!) 147/82  (!) 146/80  Pulse: 72  83 84  Resp: 15     Temp:      SpO2: 100%  98% 98%  Weight:        Final Clinical Impressions(s) / ED Diagnoses   Final diagnoses:  Umbilical hernia, incarcerated    ED Discharge Orders        Ordered    ibuprofen (ADVIL,MOTRIN) 600 MG tablet  Every 6 hours PRN     06/14/17 2325    HYDROcodone-acetaminophen (NORCO/VICODIN) 5-325 MG tablet  Every 6 hours PRN     06/14/17 2325       Antonietta Breach, PA-C 06/15/17 0005    Deno Etienne, DO 06/15/17 0014

## 2017-06-14 NOTE — ED Provider Notes (Signed)
Patient placed in Quick Look pathway, seen and evaluated   Chief Complaint: periumbilical abdominal pain and redness; seen at [facility] urgent care   HPI:   Abdominal pain to belly button x 1.5 weeks with redness noticed today.  Has noticed her belly button is sticking out when usually it is not. Had a URI last week with a lot of coughing last week when abdominal pain started. Provider at [facility] thought it was an abscess and tried to lance it, there was no drainage and was told to come to ED.   ROS:  No nausea, vomiting, diarrhea, constipation, urinary symptoms. No h/o abdominal surgery or hernias.   Physical Exam:   Gen: No distress  Neuro: Awake and Alert  Skin: Warm    Focused Exam: Afebrile. Umbilicus is numb from recent I&D, unable to assess direct TTP to umbilicus. Mild ecchymosis around and below umbilicus. Deep pressure to epigastrium causes pain radiating to belly button. Question mild umbilical hernia although exam difficult due to body habitus. Quiet bowel sounds throughout.      Initiation of care has begun. The patient has been counseled on the process, plan, and necessity for staying for the completion/evaluation, and the remainder of the medical screening examination. Concern for umbilical hernia, will get screening labs and CT A/P.     Kinnie Feil, PA-C 06/14/17 1852    Hayden Rasmussen, MD 06/15/17 1125

## 2017-06-19 ENCOUNTER — Ambulatory Visit: Payer: Self-pay | Admitting: General Surgery

## 2017-06-19 ENCOUNTER — Other Ambulatory Visit: Payer: Self-pay

## 2017-06-19 ENCOUNTER — Ambulatory Visit (HOSPITAL_COMMUNITY): Payer: BC Managed Care – PPO | Admitting: Certified Registered Nurse Anesthetist

## 2017-06-19 ENCOUNTER — Encounter (HOSPITAL_COMMUNITY)
Admission: AD | Disposition: A | Discharge status: Home / Self Care | Payer: Self-pay | Source: Ambulatory Visit | Attending: Surgery

## 2017-06-19 ENCOUNTER — Observation Stay (HOSPITAL_COMMUNITY)
Admission: AD | Admit: 2017-06-19 | Discharge: 2017-06-20 | Disposition: A | Discharge status: Home / Self Care | Payer: BC Managed Care – PPO | Source: Ambulatory Visit | Attending: Surgery | Admitting: Surgery

## 2017-06-19 ENCOUNTER — Encounter (HOSPITAL_COMMUNITY): Payer: Self-pay | Admitting: Certified Registered Nurse Anesthetist

## 2017-06-19 DIAGNOSIS — Z7951 Long term (current) use of inhaled steroids: Secondary | ICD-10-CM | POA: Insufficient documentation

## 2017-06-19 DIAGNOSIS — K420 Umbilical hernia with obstruction, without gangrene: Secondary | ICD-10-CM | POA: Diagnosis present

## 2017-06-19 DIAGNOSIS — Z793 Long term (current) use of hormonal contraceptives: Secondary | ICD-10-CM | POA: Insufficient documentation

## 2017-06-19 DIAGNOSIS — K429 Umbilical hernia without obstruction or gangrene: Secondary | ICD-10-CM | POA: Diagnosis present

## 2017-06-19 DIAGNOSIS — Z6837 Body mass index (BMI) 37.0-37.9, adult: Secondary | ICD-10-CM | POA: Diagnosis not present

## 2017-06-19 HISTORY — PX: UMBILICAL HERNIA REPAIR: SHX196

## 2017-06-19 HISTORY — DX: Family history of other specified conditions: Z84.89

## 2017-06-19 HISTORY — DX: Other specified postprocedural states: R11.2

## 2017-06-19 HISTORY — DX: Personal history of urinary calculi: Z87.442

## 2017-06-19 HISTORY — DX: Other specified postprocedural states: Z98.890

## 2017-06-19 LAB — CBC
HCT: 36.1 % (ref 36.0–46.0)
Hemoglobin: 11.2 g/dL — ABNORMAL LOW (ref 12.0–15.0)
MCH: 24.9 pg — AB (ref 26.0–34.0)
MCHC: 31 g/dL (ref 30.0–36.0)
MCV: 80.2 fL (ref 78.0–100.0)
PLATELETS: 450 10*3/uL — AB (ref 150–400)
RBC: 4.5 MIL/uL (ref 3.87–5.11)
RDW: 14.9 % (ref 11.5–15.5)
WBC: 9.9 10*3/uL (ref 4.0–10.5)

## 2017-06-19 LAB — CREATININE, SERUM
CREATININE: 0.81 mg/dL (ref 0.44–1.00)
GFR calc Af Amer: >60 mL/min (ref >60)
GFR calc non Af Amer: >60 mL/min (ref >60)

## 2017-06-19 SURGERY — REPAIR, HERNIA, UMBILICAL, ADULT
Anesthesia: General | Site: Abdomen

## 2017-06-19 SURGERY — REPAIR, HERNIA, UMBILICAL, ADULT
Anesthesia: General

## 2017-06-19 MED ORDER — PROPOFOL 10 MG/ML IV BOLUS
INTRAVENOUS | Status: AC
  Filled 2017-06-19: qty 20

## 2017-06-19 MED ORDER — DEXAMETHASONE SODIUM PHOSPHATE 10 MG/ML IJ SOLN
INTRAMUSCULAR | Status: DC | PRN
  Administered 2017-06-19: 10 mg via INTRAVENOUS

## 2017-06-19 MED ORDER — HYDROMORPHONE HCL 1 MG/ML IJ SOLN: 0.5000 mg | INTRAMUSCULAR | Status: DC | PRN

## 2017-06-19 MED ORDER — ROCURONIUM BROMIDE 100 MG/10ML IV SOLN
INTRAVENOUS | Status: DC | PRN
  Administered 2017-06-19: 20 mg via INTRAVENOUS
  Administered 2017-06-19: 40 mg via INTRAVENOUS
  Administered 2017-06-19 (×2): 30 mg via INTRAVENOUS

## 2017-06-19 MED ORDER — NEOSTIGMINE METHYLSULFATE 5 MG/5ML IV SOSY
PREFILLED_SYRINGE | INTRAVENOUS | Status: AC
  Filled 2017-06-19: qty 5

## 2017-06-19 MED ORDER — DOCUSATE SODIUM 100 MG PO CAPS
200.0000 mg | ORAL_CAPSULE | Freq: Two times a day (BID) | ORAL | Status: DC
  Administered 2017-06-19 – 2017-06-20 (×2): 200 mg via ORAL
  Filled 2017-06-19 (×2): qty 2

## 2017-06-19 MED ORDER — GLYCOPYRROLATE 0.2 MG/ML IJ SOLN
INTRAMUSCULAR | Status: DC | PRN
  Administered 2017-06-19: .6 mg via INTRAVENOUS

## 2017-06-19 MED ORDER — ONDANSETRON HCL 4 MG/2ML IJ SOLN
INTRAMUSCULAR | Status: DC | PRN
  Administered 2017-06-19: 4 mg via INTRAVENOUS

## 2017-06-19 MED ORDER — DEXAMETHASONE SODIUM PHOSPHATE 10 MG/ML IJ SOLN
INTRAMUSCULAR | Status: AC
  Filled 2017-06-19: qty 1

## 2017-06-19 MED ORDER — ACETAMINOPHEN 500 MG PO TABS: 1000.0000 mg | ORAL_TABLET | ORAL | Status: DC

## 2017-06-19 MED ORDER — LACTATED RINGERS IV SOLN
INTRAVENOUS | Status: DC | PRN
  Administered 2017-06-19 (×2): via INTRAVENOUS

## 2017-06-19 MED ORDER — PROPOFOL 10 MG/ML IV BOLUS
INTRAVENOUS | Status: DC | PRN
  Administered 2017-06-19: 200 mg via INTRAVENOUS

## 2017-06-19 MED ORDER — PROMETHAZINE HCL 25 MG/ML IJ SOLN: 6.2500 mg | INTRAMUSCULAR | Status: DC | PRN

## 2017-06-19 MED ORDER — CHLORHEXIDINE GLUCONATE 4 % EX LIQD: 60.0000 mL | Freq: Once | CUTANEOUS | Status: DC

## 2017-06-19 MED ORDER — LIDOCAINE HCL 1 % IJ SOLN
INTRAMUSCULAR | Status: DC | PRN
  Administered 2017-06-19: 30 mL

## 2017-06-19 MED ORDER — BUPIVACAINE-EPINEPHRINE (PF) 0.5% -1:200000 IJ SOLN
INTRAMUSCULAR | Status: AC
  Filled 2017-06-19: qty 30

## 2017-06-19 MED ORDER — FENTANYL CITRATE (PF) 250 MCG/5ML IJ SOLN
INTRAMUSCULAR | Status: AC
  Filled 2017-06-19: qty 5

## 2017-06-19 MED ORDER — ACETAMINOPHEN 325 MG PO TABS
650.0000 mg | ORAL_TABLET | Freq: Four times a day (QID) | ORAL | Status: DC
  Administered 2017-06-19 – 2017-06-20 (×3): 650 mg via ORAL
  Filled 2017-06-19 (×3): qty 2

## 2017-06-19 MED ORDER — LIDOCAINE HCL 1 % IJ SOLN
INTRAMUSCULAR | Status: AC
  Filled 2017-06-19: qty 20

## 2017-06-19 MED ORDER — ROCURONIUM BROMIDE 10 MG/ML (PF) SYRINGE
PREFILLED_SYRINGE | INTRAVENOUS | Status: AC
  Filled 2017-06-19: qty 5

## 2017-06-19 MED ORDER — HYDROMORPHONE HCL 1 MG/ML IJ SOLN
INTRAMUSCULAR | Status: AC
  Filled 2017-06-19: qty 1

## 2017-06-19 MED ORDER — 0.9 % SODIUM CHLORIDE (POUR BTL) OPTIME
TOPICAL | Status: DC | PRN
  Administered 2017-06-19 (×2): 1000 mL

## 2017-06-19 MED ORDER — SCOPOLAMINE 1 MG/3DAYS TD PT72
1.0000 | MEDICATED_PATCH | Freq: Once | TRANSDERMAL | Status: AC
  Administered 2017-06-19: 1 via TRANSDERMAL

## 2017-06-19 MED ORDER — LIDOCAINE 2% (20 MG/ML) 5 ML SYRINGE
INTRAMUSCULAR | Status: AC
  Filled 2017-06-19: qty 5

## 2017-06-19 MED ORDER — FENTANYL CITRATE (PF) 100 MCG/2ML IJ SOLN
INTRAMUSCULAR | Status: DC | PRN
  Administered 2017-06-19 (×5): 50 ug via INTRAVENOUS

## 2017-06-19 MED ORDER — LACTATED RINGERS IV SOLN
INTRAVENOUS | Status: DC
  Administered 2017-06-19 (×2): via INTRAVENOUS

## 2017-06-19 MED ORDER — CEFAZOLIN SODIUM 10 G IJ SOLR
3.0000 g | INTRAMUSCULAR | Status: AC
  Administered 2017-06-19: 3 g via INTRAVENOUS
  Filled 2017-06-19 (×2): qty 3000

## 2017-06-19 MED ORDER — ONDANSETRON HCL 4 MG/2ML IJ SOLN: 4.0000 mg | Freq: Four times a day (QID) | INTRAMUSCULAR | Status: DC | PRN

## 2017-06-19 MED ORDER — MEPERIDINE HCL 50 MG/ML IJ SOLN: 6.2500 mg | INTRAMUSCULAR | Status: DC | PRN

## 2017-06-19 MED ORDER — ONDANSETRON 4 MG PO TBDP: 4.0000 mg | ORAL_TABLET | Freq: Four times a day (QID) | ORAL | Status: DC | PRN

## 2017-06-19 MED ORDER — LIDOCAINE HCL (CARDIAC) 20 MG/ML IV SOLN
INTRAVENOUS | Status: DC | PRN
  Administered 2017-06-19: 40 mg via INTRAVENOUS

## 2017-06-19 MED ORDER — HYDROMORPHONE HCL 1 MG/ML IJ SOLN
0.2500 mg | INTRAMUSCULAR | Status: DC | PRN
  Administered 2017-06-19: 0.5 mg via INTRAVENOUS

## 2017-06-19 MED ORDER — MIDAZOLAM HCL 2 MG/2ML IJ SOLN
INTRAMUSCULAR | Status: AC
  Filled 2017-06-19: qty 2

## 2017-06-19 MED ORDER — MIDAZOLAM HCL 5 MG/5ML IJ SOLN
INTRAMUSCULAR | Status: DC | PRN
  Administered 2017-06-19: 2 mg via INTRAVENOUS

## 2017-06-19 MED ORDER — HEPARIN SODIUM (PORCINE) 5000 UNIT/ML IJ SOLN
5000.0000 [IU] | Freq: Three times a day (TID) | INTRAMUSCULAR | Status: DC
  Administered 2017-06-19 – 2017-06-20 (×2): 5000 [IU] via SUBCUTANEOUS
  Filled 2017-06-19 (×2): qty 1

## 2017-06-19 MED ORDER — IBUPROFEN 600 MG PO TABS
600.0000 mg | ORAL_TABLET | Freq: Four times a day (QID) | ORAL | Status: DC
  Administered 2017-06-19 – 2017-06-20 (×3): 600 mg via ORAL
  Filled 2017-06-19 (×3): qty 1

## 2017-06-19 MED ORDER — IPRATROPIUM BROMIDE 0.06 % NA SOLN: 2.0000 | Freq: Three times a day (TID) | NASAL | Status: DC | PRN

## 2017-06-19 MED ORDER — NEOSTIGMINE METHYLSULFATE 10 MG/10ML IV SOLN
INTRAVENOUS | Status: DC | PRN
  Administered 2017-06-19: 4 mg via INTRAVENOUS

## 2017-06-19 MED ORDER — ONDANSETRON HCL 4 MG/2ML IJ SOLN
INTRAMUSCULAR | Status: AC
  Filled 2017-06-19: qty 2

## 2017-06-19 MED ORDER — OXYCODONE HCL 5 MG PO TABS
10.0000 mg | ORAL_TABLET | Freq: Four times a day (QID) | ORAL | Status: DC | PRN
  Filled 2017-06-19: qty 2

## 2017-06-19 MED ORDER — GABAPENTIN 300 MG PO CAPS: 300.0000 mg | ORAL_CAPSULE | ORAL | Status: DC

## 2017-06-19 MED ORDER — MIDAZOLAM HCL 2 MG/2ML IJ SOLN: 0.5000 mg | Freq: Once | INTRAMUSCULAR | Status: DC | PRN

## 2017-06-19 SURGICAL SUPPLY — 47 items
ADH SKN CLS APL DERMABOND .7 (GAUZE/BANDAGES/DRESSINGS) ×1
BLADE CLIPPER SURG (BLADE) IMPLANT
BLADE SURG 15 STRL LF DISP TIS (BLADE) ×1 IMPLANT
BLADE SURG 15 STRL SS (BLADE) ×3
CANISTER SUCT 3000ML PPV (MISCELLANEOUS) ×2 IMPLANT
CHLORAPREP W/TINT 26ML (MISCELLANEOUS) ×3 IMPLANT
COVER SURGICAL LIGHT HANDLE (MISCELLANEOUS) ×3 IMPLANT
DERMABOND ADVANCED (GAUZE/BANDAGES/DRESSINGS) ×2
DERMABOND ADVANCED .7 DNX12 (GAUZE/BANDAGES/DRESSINGS) ×1 IMPLANT
DRAPE LAPAROTOMY 100X72 PEDS (DRAPES) ×3 IMPLANT
DRAPE UTILITY XL STRL (DRAPES) ×3 IMPLANT
DRSG OPSITE POSTOP 4X6 (GAUZE/BANDAGES/DRESSINGS) ×2 IMPLANT
ELECT CAUTERY BLADE 6.4 (BLADE) ×3 IMPLANT
ELECT REM PT RETURN 9FT ADLT (ELECTROSURGICAL) ×3
ELECTRODE REM PT RTRN 9FT ADLT (ELECTROSURGICAL) ×1 IMPLANT
GAUZE SPONGE 4X4 16PLY XRAY LF (GAUZE/BANDAGES/DRESSINGS) ×3 IMPLANT
GLOVE BIO SURGEON STRL SZ7.5 (GLOVE) ×3 IMPLANT
GLOVE BIOGEL PI IND STRL 6 (GLOVE) IMPLANT
GLOVE BIOGEL PI IND STRL 6.5 (GLOVE) IMPLANT
GLOVE BIOGEL PI IND STRL 7.0 (GLOVE) IMPLANT
GLOVE BIOGEL PI INDICATOR 6 (GLOVE) ×4
GLOVE BIOGEL PI INDICATOR 6.5 (GLOVE) ×2
GLOVE BIOGEL PI INDICATOR 7.0 (GLOVE) ×2
GLOVE INDICATOR 8.0 STRL GRN (GLOVE) ×3 IMPLANT
GOWN STRL REUS W/ TWL LRG LVL3 (GOWN DISPOSABLE) ×1 IMPLANT
GOWN STRL REUS W/ TWL XL LVL3 (GOWN DISPOSABLE) ×1 IMPLANT
GOWN STRL REUS W/TWL LRG LVL3 (GOWN DISPOSABLE) ×3
GOWN STRL REUS W/TWL XL LVL3 (GOWN DISPOSABLE) ×3
KIT BASIN OR (CUSTOM PROCEDURE TRAY) ×3 IMPLANT
KIT ROOM TURNOVER OR (KITS) ×3 IMPLANT
LIGASURE IMPACT 36 18CM CVD LR (INSTRUMENTS) ×2 IMPLANT
NDL HYPO 25GX1X1/2 BEV (NEEDLE) ×1 IMPLANT
NEEDLE HYPO 25GX1X1/2 BEV (NEEDLE) ×3 IMPLANT
NS IRRIG 1000ML POUR BTL (IV SOLUTION) ×3 IMPLANT
PACK SURGICAL SETUP 50X90 (CUSTOM PROCEDURE TRAY) ×3 IMPLANT
PAD ARMBOARD 7.5X6 YLW CONV (MISCELLANEOUS) ×3 IMPLANT
PENCIL BUTTON HOLSTER BLD 10FT (ELECTRODE) ×3 IMPLANT
STAPLER VISISTAT 35W (STAPLE) ×2 IMPLANT
SUT NOVA NAB DX-16 0-1 5-0 T12 (SUTURE) ×4 IMPLANT
SUT VIC AB 2-0 SH 18 (SUTURE) ×2 IMPLANT
SYR BULB 3OZ (MISCELLANEOUS) ×3 IMPLANT
SYR CONTROL 10ML LL (SYRINGE) ×3 IMPLANT
TOWEL OR 17X24 6PK STRL BLUE (TOWEL DISPOSABLE) ×3 IMPLANT
TOWEL OR 17X26 10 PK STRL BLUE (TOWEL DISPOSABLE) ×3 IMPLANT
TUBE CONNECTING 12'X1/4 (SUCTIONS)
TUBE CONNECTING 12X1/4 (SUCTIONS) IMPLANT
YANKAUER SUCT BULB TIP NO VENT (SUCTIONS) ×2 IMPLANT

## 2017-06-19 NOTE — Transfer of Care (Signed)
Immediate Anesthesia Transfer of Care Note  Patient: Wanda Richardson  Procedure(s) Performed: HERNIA REPAIR UMBILICAL ADULT WITH POSSIBLE MESH (N/A Abdomen)  Patient Location: PACU  Anesthesia Type:General  Level of Consciousness: awake, alert  and oriented  Airway & Oxygen Therapy: Patient Spontanous Breathing and Patient connected to nasal cannula oxygen  Post-op Assessment: Report given to RN and Post -op Vital signs reviewed and stable  Post vital signs: Reviewed and stable  Last Vitals:  Vitals:   06/19/17 1030 06/19/17 1258  BP: (!) 159/87   Pulse: 89   Resp: 18   Temp: 36.4 C (!) (P) 36.2 C  SpO2: 99%     Last Pain:  Vitals:   06/19/17 1258  TempSrc:   PainSc: (P) Asleep      Patients Stated Pain Goal: 2 (92/33/00 7622)  Complications: No apparent anesthesia complications

## 2017-06-19 NOTE — H&P (Signed)
Please refer to H&P from Dr. Redmond Pulling dated today  Sharon Mt. Dema Severin, M.D. General and Colorectal Surgery [facility]

## 2017-06-19 NOTE — Op Note (Signed)
06/19/2017  12:42 PM  PATIENT:  Wanda Richardson  42 y.o. female  Patient Care Team: Rory Percy, MD as PCP - General (Family Medicine)  PRE-OPERATIVE DIAGNOSIS:  umbilical hernia  POST-OPERATIVE DIAGNOSIS:  umbilical hernia  PROCEDURE:  Open umbilical hernia repair  SURGEON:  Sharon Mt. Brae Gartman, MD  ASSISTANT: None  ANESTHESIA:   general  COUNTS:  Sponge, needle and instrument counts were reported correct x2 at the conclusion of the operation.  EBL: 20cc  DRAINS: None  SPECIMEN:  1. Umbilical skin 2. Omentum 3. Hernia sac  FINDINGS: Strangulated omentum containing umbilical hernia. Hernia sac was very thickened consistent with prolonged incarceration/strangulation. Umbilicus was no salvageable due to dusky skin changes to it from the prolonged strangulation of the underlying hernia. 3 x 2 cm hernia defect. Repaired primarily given concern for possible mesh infection from somewhat dirty field from strangulated omentum and cellulitis as well as the nature of the dirty umbilicus.  DISPOSITION: PACU in satisfactory condition  INDICATION: Wanda Richardson is a very pleasant 41yoF sent over to [facility] by my partner Dr. Redmond Pulling for further evaluation. She was seen about a week ago at an urgent care a painful abdominal wall lump with some cellulitis. They made a small incision thinking this was actually an abscess and she was sent to the ER. A CT scan was performed demonstrating an incarcerated fat continaing ventral hernia with soft tissue inflammation c/f strangulation. She was subsequently discharged. She saw my partner today who called me for potential urgent surgery. She denies f/c/n/v. She has been tolerating a diet and have bowel movements. On exam she had periumbilical erythema with stab incision noted; firm incarcerated/strangulated hernia at this location. No tenderness elsewhere. No rebound/guarding. Given all of this, she was recommended urgent open repair.  The anatomy & physiology of  the abdominal wall and hernias was discussed.  The pathophysiology of incarcerated/strangulated hernias was discussed.  Natural history risks without surgery was discussed. The technical aspects of the procedure and plan for an open approach were discussed.  Given the cellulitis we discussed the approaches but plan to avoid mesh due to concerns for infection - planning primary repair. Based on intraoperative findings, will make decision regarding skin closure.  The planned procedure, material risks (including but not limited to pain, bleeding, infection, abscess, injury to other organs/viscus, recurrence of her hernia, evisceration, need for additional procedures, heart attack, stroke, death.  Her and her husbands questions were answered to their satisfaction. She voiced understanding and elected to proceed with surgery.  DESCRIPTION: The patient was identified in preop holding and taken to the OR where they were placed on the operating room table and SCDs were placed. General endotracheal anesthesia was induced without difficulty. The patient was then prepped and draped in the usual sterile fashion. A surgical timeout was performed indicating the correct patient, procedure, positioning and need for preoperative antibiotics.   A midline incision was made around the umbilicus and carried down around the hernia sac. The midline fascia was identified superior and inferior to the hernia sac. The sac was circumferentially dissected. The hernia sac was known to be strangulated and contain only fat on CT. The sac was opened at it's apex. Strangulated omentum was identified. The sac was circumferentially carefully dissected from the surrounding fascia and sent off as specimen. The omentum was now able to be pulled up into the wound and all necrotic/strangulated omentum was excised with a Ligasure. Care was taken not to injure any underlying bowel. The  omentum was passed off as specimen. The surrounding subcutaneous  tissue was cleared from the underlying fascia around the hernia defect. The defect measured 3 x 2cm in size. The umbilicus was not salvageable and was therefore resected. The fascia was then examined and closing the fascia in a transverse fashion resulted in a tension free closure so this was selected. #1 interrupted Novafil sutures were used to close the defect primarily. The wound was irrigated with 2L of warm saline. Hemostasis was verified. The subcutaneous tissue was then approximated using interrupted 2-0 Vicryl suture. The skin was then approximated with staples. A sterile dressing was placed. The patient was then awakened from general anesthesia, extubated and transferred to a stretcher for transport to PACU in satisfactory condition.  Note: This dictation was prepared with Dragon/digital dictation along with [employer]. Any transcriptional errors that result from this process are unintentional.

## 2017-06-19 NOTE — Anesthesia Preprocedure Evaluation (Addendum)
Anesthesia Evaluation  Patient identified by MRN, date of birth, ID band Patient awake    Reviewed: Allergy & Precautions, NPO status , Patient's Chart, lab work & pertinent test results  History of Anesthesia Complications Negative for: history of anesthetic complications  Airway Mallampati: II  TM Distance: >3 FB Neck ROM: Full    Dental  (+) Dental Advisory Given   Pulmonary neg pulmonary ROS,    breath sounds clear to auscultation       Cardiovascular negative cardio ROS   Rhythm:Regular Rate:Normal     Neuro/Psych negative neurological ROS     GI/Hepatic Neg liver ROS, Incarcerated umbilical hernia   Endo/Other  Morbid obesity  Renal/GU negative Renal ROS     Musculoskeletal   Abdominal (+) + obese,   Peds  Hematology negative hematology ROS (+)   Anesthesia Other Findings   Reproductive/Obstetrics Takes OCPs, LMP presently                            Anesthesia Physical Anesthesia Plan  ASA: II  Anesthesia Plan: General   Post-op Pain Management:    Induction: Intravenous  PONV Risk Score and Plan: 3 and Ondansetron, Dexamethasone and Scopolamine patch - Pre-op  Airway Management Planned: Oral ETT  Additional Equipment:   Intra-op Plan:   Post-operative Plan: Extubation in OR  Informed Consent: I have reviewed the patients History and Physical, chart, labs and discussed the procedure including the risks, benefits and alternatives for the proposed anesthesia with the patient or authorized representative who has indicated his/her understanding and acceptance.   Dental advisory given  Plan Discussed with: CRNA and Surgeon  Anesthesia Plan Comments: (Plan routine monitors, GETA)        Anesthesia Quick Evaluation

## 2017-06-19 NOTE — H&P (Deleted)
  The note originally documented on this encounter has been moved the the encounter in which it belongs.  

## 2017-06-19 NOTE — Anesthesia Postprocedure Evaluation (Signed)
Anesthesia Post Note  Patient: Wanda Richardson  Procedure(s) Performed: HERNIA REPAIR UMBILICAL ADULT WITH POSSIBLE MESH (N/A Abdomen)     Patient location during evaluation: PACU Anesthesia Type: General Level of consciousness: sedated, patient cooperative and oriented Pain management: pain level controlled Vital Signs Assessment: post-procedure vital signs reviewed and stable Respiratory status: spontaneous breathing, nonlabored ventilation and respiratory function stable Cardiovascular status: blood pressure returned to baseline and stable Postop Assessment: no apparent nausea or vomiting Anesthetic complications: no    Last Vitals:  Vitals:   06/19/17 1342 06/19/17 1357  BP: 136/73 123/68  Pulse: 62 64  Resp: (!) 25 (!) 23  Temp:    SpO2: 94% 92%    Last Pain:  Vitals:   06/19/17 1345  TempSrc:   PainSc: Asleep                 Paitlyn Mcclatchey,E. Madisyn Mawhinney

## 2017-06-19 NOTE — Progress Notes (Signed)
Orthopedic Tech Progress Note Patient Details:  Wanda Richardson 1976-03-13 [id number]  Ortho Devices Type of Ortho Device: Abdominal binder Ortho Device/Splint Interventions: Loanne Drilling, Kaelin Holford 06/19/2017, 1:18 PM

## 2017-06-19 NOTE — Progress Notes (Signed)
Subjective Wanda Richardson is a very pleasant 35yoF sent over to [facility] by my partner Dr. Redmond Pulling for further evaluation. She was seen about a week ago at an urgent care a painful abdominal wall lump with some cellulitis. They made a small incision thinking this was actually an abscess and she was sent to the ER. A CT scan was performed demonstrating an incarcerated fat continaing ventral hernia with soft tissue inflammation c/f strangulation. She was subsequently discharged. She saw my partner today who called me for potential urgent surgery. She denies f/c/n/v. She has been tolerating a diet and have bowel movements.  PMH: Obesity  PSH: Oral surgery  Objective: Vital signs in last 24 hours: Temp:  [97.6 F (36.4 C)] 97.6 F (36.4 C) (03/06 1030) Pulse Rate:  [89] 89 (03/06 1030) Resp:  [18] 18 (03/06 1030) BP: (159)/(87) 159/87 (03/06 1030) SpO2:  [99 %] 99 % (03/06 1030) Weight:  [102.1 kg (225 lb)] 102.1 kg (225 lb) (03/06 1030)    Intake/Output from previous day: No intake/output data recorded. Intake/Output this shift: No intake/output data recorded.  Gen: NAD, comfortable, nervous CV: RRR Pulm: Normal work of breathing Abd: Obese, soft, nondistended. Periumbilical erythema with stab incision noted; firm incarcerated/strangulated hernia at this location. No tenderness elsewhere. No rebound/guarding. Ext: SCDs in place  Lab Results: CBC  No results for input(s): WBC, HGB, HCT, PLT in the last 72 hours. BMET No results for input(s): NA, K, CL, CO2, GLUCOSE, BUN, CREATININE, CALCIUM in the last 72 hours. PT/INR No results for input(s): LABPROT, INR in the last 72 hours. ABG No results for input(s): PHART, HCO3 in the last 72 hours.  Invalid input(s): PCO2, PO2  Studies/Results:  Anti-infectives: Anti-infectives (From admission, onward)   Start     Dose/Rate Route Frequency Ordered Stop   06/19/17 1015  ceFAZolin (ANCEF) 3 g in dextrose 5 % 50 mL IVPB     3 g 130 mL/hr over 30  Minutes Intravenous On call to O.R. 06/19/17 1012 06/20/17 0559       Assessment/Plan: Ms. Wanda Richardson is a pleasant 85yoF with strangulated fat containing umbilical hernia  The anatomy & physiology of the abdominal wall and hernias was discussed.  The pathophysiology of incarcerated/strangulated hernias was discussed.  Natural history risks without surgery was discussed. The technical aspects of the procedure and plan for an open approach were discussed.  Given the cellulitis we discussed the approaches but plan to avoid mesh due to concerns for infection - planning primary repair. Based on intraoperative findings, will make decision regarding skin closure.  The planned procedure, material risks (including but not limited to pain, bleeding, infection, abscess, injury to other organs/viscus, recurrence of her hernia, evisceration, need for additional procedures, heart attack, stroke, death.  Her and her husbands questions were answered to their satisfaction. She voiced understanding and elected to proceed with surgery.   LOS: 0 days   Sharon Mt. Dema Severin, M.D. General and Colorectal Surgery [facility]

## 2017-06-19 NOTE — Progress Notes (Signed)
Patient arrived to 6N21, easily arousable and oriented x4, VSS, IV intact and infusing.  Noted to have midline incision with honey comb dressing and abdominal binder present. Pain 4/10.  Family at bedside.  Patient educated on incentive spirometry.  Patient and family oriented to room and equipment.  Will continue to monitor.

## 2017-06-19 NOTE — H&P (Signed)
Wanda Richardson Documented: 06/19/2017 8:59 AM Location: [facility] Patient #: [id number] DOB: [birth year] Married / Language: Wanda Richardson / Race: White Female  History of Present Illness [name] M. Aasim Restivo MD; 06/19/2017 9:43 AM) The patient is a 42 year old female who presents with an umbilical hernia. She is referred by Dr Tyrone Nine for evaluation of an incarcerated umbilical hernia. She states that she has been having pain around her umbilicus for about a week. Last Friday morning she woke up with redness and went to urgent care. They thought it was an abscess and she underwent a small incision and drainage. They realized it was not consistent with an abscess and she was sent to the emergency room. A CT scan was performed which demonstrated an incarcerated umbilical hernia containing adipose tissue. There was soft tissue inflammation. Hernia measures 4 cm wide by about 2.5 cm vertical. She was discharged from the emergency room and referred to [place]. She has no nausea or vomiting. No diarrhea or constipation. She had a fever last Wednesday but none since then. She has been taking ibuprofen about every 6-8 hours since Friday for the discomfort. She is still tender in the area. She has a history of kidney stones and ovarian cysts in the past. She does not smoke. She is a Radio producer. She denies any chest pressure, chest tightness, source of breath, orthopnea. She has never had any abdominal surgery.   Problem List/Past Medical [name] M. Redmond Pulling, MD; 06/19/2017 9:43 AM) [name] UMBILICAL HERNIA (E26.8)  Past Surgical History (Wanda Richardson, [place]; 06/19/2017 8:59 AM) Oral Surgery  Diagnostic Studies History (Wanda Richardson, [place]; 06/19/2017 8:59 AM) Colonoscopy never Mammogram 1-3 years ago Pap Smear 1-5 years ago  Allergies (Wanda Richardson, [place]; 06/19/2017 9:00 AM) No Known Drug Allergies [06/19/2017]: Allergies Reconciled  Medication History (Wanda Richardson, RMA;  06/19/2017 9:00 AM) Ibuprofen (600MG  Tablet, Oral) Active. Norethin Ace-Eth Estrad-FE (1-20MG -MCG Tablet, Oral) Active. Flonase (50MCG/DOSE Inhaler, Nasal) Active. Medications Reconciled  Social History (Wanda Richardson, [place]; 06/19/2017 8:59 AM) Caffeine use Carbonated beverages, Coffee, Tea. No alcohol use No drug use Tobacco use Never smoker.  Family History (Wanda Richardson, [place]; 06/19/2017 8:59 AM) Arthritis Family Members In General. Depression Mother. Diabetes Mellitus Father, Mother. Heart Disease Family Members In General. Hypertension Father. Malignant Neoplasm Of Pancreas Family Members In General.  Pregnancy / Birth History (Wanda Richardson, RMA; 06/19/2017 8:59 AM) Age at menarche 5 years. Contraceptive History Oral contraceptives. Gravida 2 Maternal age 44-25 Para 2 Regular periods  Other Problems [name] M. Redmond Pulling, MD; 06/19/2017 9:43 AM) Kidney Wanda Richardson     Review of Systems (Wanda A. Brown RMA; 06/19/2017 8:59 AM) General Not Present- Appetite Loss, Chills, Fatigue, Fever, Night Sweats, Weight Gain and Weight Loss. Skin Not Present- Change in Wart/Mole, Dryness, Hives, Jaundice, New Lesions, Non-Healing Wounds, Rash and Ulcer. HEENT Present- Seasonal Allergies. Not Present- Earache, Hearing Loss, Hoarseness, Nose Bleed, Oral Ulcers, Ringing in the Ears, Sinus Pain, Sore Throat, Visual Disturbances, Wears glasses/contact lenses and Yellow Eyes. Respiratory Not Present- Bloody sputum, Chronic Cough, Difficulty Breathing, Snoring and Wheezing. Breast Not Present- Breast Mass, Breast Pain, Nipple Discharge and Skin Changes. Cardiovascular Not Present- Chest Pain, Difficulty Breathing Lying Down, Leg Cramps, Palpitations, Rapid Heart Rate, Shortness of Breath and Swelling of Extremities. Gastrointestinal Present- Abdominal Pain. Not Present- Bloating, Bloody Stool, Change in Bowel Habits, Chronic diarrhea, Constipation, Difficulty Swallowing, Excessive  gas, Gets full quickly at meals, Hemorrhoids, Indigestion, Nausea, Rectal Pain and Vomiting. Female Genitourinary  Not Present- Frequency, Nocturia, Painful Urination, Pelvic Pain and Urgency. Musculoskeletal Not Present- Back Pain, Joint Pain, Joint Stiffness, Muscle Pain, Muscle Weakness and Swelling of Extremities. Neurological Not Present- Decreased Memory, Fainting, Headaches, Numbness, Seizures, Tingling, Tremor, Trouble walking and Weakness. Psychiatric Not Present- Anxiety, Bipolar, Change in Sleep Pattern, Depression, Fearful and Frequent crying. Endocrine Not Present- Cold Intolerance, Excessive Hunger, Hair Changes, Heat Intolerance, Hot flashes and New Diabetes. Hematology Not Present- Blood Thinners, Easy Bruising, Excessive bleeding, Gland problems, HIV and Persistent Infections.  Vitals (Wanda A. Brown RMA; 06/19/2017 9:00 AM) 06/19/2017 8:59 AM Weight: 244.8 lb Height: 65.5in Body Surface Area: 2.17 m Body Mass Index: 40.12 kg/m  Temp.: 98.38F  Pulse: 84 (Regular)  BP: 126/78 (Sitting, Left Arm, Standard)      Physical Exam [name] M. Jarmal Lewelling MD; 06/19/2017 9:41 AM)  General Mental Status-Alert. General Appearance-Consistent with stated age. Hydration-Well hydrated. Voice-Normal. Note: obese  Head and Neck Head-normocephalic, atraumatic with no lesions or palpable masses. Trachea-midline. Thyroid Gland Characteristics - normal size and consistency.  Eye Eyeball - Bilateral-Extraocular movements intact. Sclera/Conjunctiva - Bilateral-No scleral icterus.  ENMT Ears -Note:normal external ears lips intact.   Chest and Lung Exam Chest and lung exam reveals -quiet, even and easy respiratory effort with no use of accessory muscles and on auscultation, normal breath sounds, no adventitious sounds and normal vocal resonance. Inspection Chest Wall - Normal. Back - normal.  Breast - Did not examine.  Cardiovascular Cardiovascular  examination reveals -normal heart sounds, regular rate and rhythm with no murmurs and normal pedal pulses bilaterally.  Abdomen Inspection  Inspection of the abdomen reveals: Note: Swelling of the umbilicus. Mild cellulitis. Healing small incision. Very tender to palpation in that area. Did not attempt to try to reduce the hernia. Skin - Scar - no surgical scars. Palpation/Percussion Palpation and Percussion of the abdomen reveal - Soft, Non Tender, No Rebound tenderness, No Rigidity (guarding) and No hepatosplenomegaly. Auscultation Auscultation of the abdomen reveals - Bowel sounds normal.  Peripheral Vascular Upper Extremity Palpation - Pulses bilaterally normal.  Neurologic Neurologic evaluation reveals -alert and oriented x 3 with no impairment of recent or remote memory. Mental Status-Normal.  Neuropsychiatric The patient's mood and affect are described as -normal. Judgment and Insight-insight is appropriate concerning matters relevant to self.  Musculoskeletal Normal Exam - Left-Upper Extremity Strength Normal and Lower Extremity Strength Normal. Normal Exam - Right-Upper Extremity Strength Normal and Lower Extremity Strength Normal.  Lymphatic Head & Neck  General Head & Neck Lymphatics: Bilateral - Description - Normal. Axillary - Did not examine. Femoral & Inguinal - Did not examine.    Assessment & Plan [name] M. Annalysia Willenbring MD; 06/19/2017 9:40 AM)  [name] UMBILICAL HERNIA (Z61.0) Impression: She has an incarcerated fat-containing umbilical hernia. She has cellulitis and is very tender to palpation in the area. On CT last Friday night she had inflammation and possible necrosis of her fat tissue. I do not believe she can wait for elective repair. Therefore we will make arrangements to admit her to the hospital today for urgent intervention.  We discussed the etiology of ventral hernias. We discussed the signs and symptoms of incarceration and  strangulation. The patient was given educational material. I also drew diagrams.  With respect to operative management, we discussed both open repair  We discussed the risk and benefits of surgery including but not limited to bleeding, infection, injury to surrounding structures, hernia recurrence, mesh complications, hematoma/seroma formation,, blood clot formation, urinary retention, post operative ileus, general anesthesia  risk, long-term abdominal pain. We discussed that this procedure can be quite uncomfortable and difficult to recover from based on how the mesh is secured to the abdominal wall. We discussed the importance of avoiding heavy lifting and straining for a period of 6 weeks.  We discussed that since she has inflammation on imaging as well as cellulitis that the use of mesh would be an intraoperative decision. We also discussed the possibility that her skin may need to be left open in order to prevent a wound infection and this would require dressing changes. We discussed that if we are not able to place intra-abdominal mesh then her risk of recurrence is increased.  I have discussed the case with Dr Dema Severin  Current Plans Pt Education - Pamphlet Given - Hernia Surgery: discussed with patient and provided information.  Leighton Ruff. Redmond Pulling, MD, FACS General, Bariatric, & Minimally Invasive Surgery [facility], [place]

## 2017-06-19 NOTE — Anesthesia Procedure Notes (Addendum)
Procedure Name: Intubation Date/Time: 06/19/2017 11:23 AM Performed by: Candis Shine, CRNA Pre-anesthesia Checklist: Patient identified, Emergency Drugs available, Suction available and Patient being monitored Patient Re-evaluated:Patient Re-evaluated prior to induction Oxygen Delivery Method: Circle System Utilized Preoxygenation: Pre-oxygenation with 100% oxygen Induction Type: IV induction Ventilation: Mask ventilation without difficulty Laryngoscope Size: Mac and 3 Grade View: Grade I Tube type: Oral Tube size: 7.0 mm Number of attempts: 1 Airway Equipment and Method: Stylet and Oral airway Placement Confirmation: ETT inserted through vocal cords under direct vision,  positive ETCO2 and breath sounds checked- equal and bilateral Secured at: 21 cm Tube secured with: Tape Dental Injury: Teeth and Oropharynx as per pre-operative assessment  Comments: Intubation performed by EMT student Wynelle Link

## 2017-06-20 ENCOUNTER — Encounter (HOSPITAL_COMMUNITY): Payer: Self-pay | Admitting: Surgery

## 2017-06-20 DIAGNOSIS — K420 Umbilical hernia with obstruction, without gangrene: Secondary | ICD-10-CM | POA: Diagnosis not present

## 2017-06-20 LAB — BASIC METABOLIC PANEL
ANION GAP: 12 (ref 5–15)
BUN: 9 mg/dL (ref 6–20)
CHLORIDE: 102 mmol/L (ref 101–111)
CO2: 25 mmol/L (ref 22–32)
CREATININE: 0.77 mg/dL (ref 0.44–1.00)
Calcium: 8.8 mg/dL — ABNORMAL LOW (ref 8.9–10.3)
GFR calc non Af Amer: >60 mL/min (ref >60)
Glucose, Bld: 111 mg/dL — ABNORMAL HIGH (ref 65–99)
POTASSIUM: 4.1 mmol/L (ref 3.5–5.1)
Sodium: 139 mmol/L (ref 135–145)

## 2017-06-20 LAB — MAGNESIUM: MAGNESIUM: 2 mg/dL (ref 1.7–2.4)

## 2017-06-20 LAB — CBC
HEMATOCRIT: 32.6 % — AB (ref 36.0–46.0)
HEMOGLOBIN: 10 g/dL — AB (ref 12.0–15.0)
MCH: 24.6 pg — ABNORMAL LOW (ref 26.0–34.0)
MCHC: 30.7 g/dL (ref 30.0–36.0)
MCV: 80.3 fL (ref 78.0–100.0)
Platelets: 453 10*3/uL — ABNORMAL HIGH (ref 150–400)
RBC: 4.06 MIL/uL (ref 3.87–5.11)
RDW: 14.8 % (ref 11.5–15.5)
WBC: 9.4 10*3/uL (ref 4.0–10.5)

## 2017-06-20 LAB — PHOSPHORUS: PHOSPHORUS: 3.6 mg/dL (ref 2.5–4.6)

## 2017-06-20 LAB — HIV ANTIBODY (ROUTINE TESTING W REFLEX): HIV SCREEN 4TH GENERATION: NONREACTIVE

## 2017-06-20 NOTE — Discharge Summary (Signed)
Patient ID: Wanda Richardson MRN: [id number] DOB/AGE: 43-24-1977 43 y.o.  Admit date: 06/19/2017 Discharge date: 06/20/2017  Discharge Diagnoses Patient Active Problem List   Diagnosis Date Noted  . Umbilical hernia 22/97/9892    Consultants None  Procedures Open umbilical hernia repair 04/16/92 without mesh for strangulated fat containing umbilical hernia  Hospital Course: She was taken to the OR 3.6.19 and underwent the above procedure. On the day following surgery, she was ambulating, her pain was controlled with oral analgesics and she was tolerating a diet. She was deemed stable for discharge home.   Allergies as of 06/20/2017   No Known Allergies     Medication List    TAKE these medications   benzonatate 100 MG capsule Commonly known as:  TESSALON Take 200 mg by mouth 3 (three) times daily as needed for cough.   HYDROcodone-acetaminophen 5-325 MG tablet Commonly known as:  NORCO/VICODIN Take 1-2 tablets by mouth every 6 (six) hours as needed for severe pain.   ibuprofen 600 MG tablet Commonly known as:  ADVIL,MOTRIN Take 1 tablet (600 mg total) by mouth every 6 (six) hours as needed. What changed:  reasons to take this   ipratropium 0.06 % nasal spray Commonly known as:  ATROVENT Place 2 sprays into both nostrils 3 (three) times daily as needed.          Sharon Mt. Dema Severin, M.D. [facility]

## 2017-06-20 NOTE — Progress Notes (Signed)
Pt was discharged to go home with her husband.  She tolerated regular diet well.  Discharge instructions were given.

## 2017-06-20 NOTE — Progress Notes (Signed)
Subjective No acute events. Doing quite well. Denies n/v. Ambulating. Pain controlled.  Objective: Vital signs in last 24 hours: Temp:  [97.2 F (36.2 C)-99.3 F (37.4 C)] 98.5 F (36.9 C) (03/07 0509) Pulse Rate:  [60-89] 60 (03/07 0509) Resp:  [17-29] 18 (03/07 0509) BP: (115-159)/(67-87) 115/69 (03/07 0509) SpO2:  [90 %-99 %] 98 % (03/07 0509) Weight:  [102.1 kg (225 lb)] 102.1 kg (225 lb) (03/06 1030) Last BM Date: 06/18/17  Intake/Output from previous day: 03/06 0701 - 03/07 0700 In: 2418.8 [P.O.:120; I.V.:2298.8] Out: 20 [Blood:20] Intake/Output this shift: No intake/output data recorded.  Gen: NAD, comfortable CV: RRR Pulm: Normal work of breathing Abd: Obese, soft, NT/ND; incision dressed and dry. Binder in place. Ext: SCDs in place  Lab Results: CBC  Recent Labs    06/19/17 1436 06/20/17 0439  WBC 9.9 9.4  HGB 11.2* 10.0*  HCT 36.1 32.6*  PLT 450* 453*   BMET Recent Labs    06/19/17 1436 06/20/17 0439  NA  --  139  K  --  4.1  CL  --  102  CO2  --  25  GLUCOSE  --  111*  BUN  --  9  CREATININE 0.81 0.77  CALCIUM  --  8.8*    Assessment/Plan: Patient Active Problem List   Diagnosis Date Noted  . Umbilical hernia 18/84/1660   s/p Procedure(s): OPEN HERNIA REPAIR UMBILICAL ADULT 09/17/158  -Regular diet -Ambulate 5x/day, IS 10x/hr while awake -Anticipate possible discharge later today if tolerates diet -Instructions on goals of care and expectations postop explained. 10lb weight lifting restriction reinforced   LOS: 1 day   Sharon Mt. Dema Severin, M.D. General and Colorectal Surgery [facility]

## 2017-06-20 NOTE — Discharge Instructions (Signed)
POST OP INSTRUCTIONS  1. DIET: As tolerated. Follow a light bland diet the first 24 hours after arrival home, such as soup, liquids, crackers, etc.  Be sure to include lots of fluids daily.  Avoid fast food or heavy meals as your are more likely to get nauseated.  Eat a low fat the next few days after surgery.  2. Take your usually prescribed home medications unless otherwise directed.  3. PAIN CONTROL: a. Pain is best controlled by a usual combination of three different methods TOGETHER: i. Ice/Heat ii. Over the counter pain medication iii. Prescription pain medication b. Most patients will experience some swelling and bruising around the surgical site.  Ice packs or heating pads (30-60 minutes up to 6 times a day) will help. Some people prefer to use ice alone, heat alone, alternating between ice & heat.  Experiment to what works for you.  Swelling and bruising can take several weeks to resolve.   c. It is helpful to take an over-the-counter pain medication regularly for the first few weeks: i. Ibuprofen (Motrin/Advil) - 200mg  tabs - take 3 tabs (600mg ) every 6 hours as needed for pain ii. Acetaminophen (Tylenol) - you may take 650mg  every 6 hours as needed. You can take this with motrin as they act differently on the body. If you are taking a narcotic pain medication that has acetaminophen in it, do not take over the counter tylenol at the same time.  Iii. NOTE: You may take both of these medications together - most patients  find it most helpful when alternating between the two (i.e. Ibuprofen at 6am,  tylenol at 9am, ibuprofen at 12pm ...) d. A  prescription for pain medication should be given to you upon discharge.  Take your pain medication as prescribed if your pain is not adequatly controlled with the over-the-counter pain reliefs mentioned above.  4. Avoid getting constipated.  Between the surgery and the pain medications, it is common to experience some constipation.  Increasing fluid  intake and taking a fiber supplement (such as Metamucil, Citrucel, FiberCon, MiraLax, etc) 1-2 times a day regularly will usually help prevent this problem from occurring.  A mild laxative (prune juice, Milk of Magnesia, MiraLax, etc) should be taken according to package directions if there are no bowel movements after 48 hours.    5. Dressing: Your incision is closed with staples. Keep wound dressed until you are 48hrs out from surgery, then it's ok to remove the covering dressing. For the first 3-4 days, keep it dressed and dry when bathing. Avoid baths/pools/lakes/oceans until your wounds have fully healed and the staples are removed. These will be removed at your follow-up appointment in 2 weeks  6. ACTIVITIES as tolerated:   a. Avoid heavy lifting (>10lbs or 1 gallon of milk) for the next 6 weeks. b. You may resume regular (light) daily activities beginning the next day--such as daily self-care, walking, climbing stairs--gradually increasing activities as tolerated.  If you can walk 30 minutes without difficulty, it is safe to try more intense activity such as jogging, treadmill, bicycling, low-impact aerobics.  c. DO NOT PUSH THROUGH PAIN.  Let pain be your guide: If it hurts to do something, don't do it. d. Dennis Bast may drive when you are no longer taking prescription pain medication, you can comfortably wear a seatbelt, and you can safely maneuver your car and apply brakes.   7. FOLLOW UP in our office a. Please call [facility] at [phone] to set up an appointment  to see your surgeon in the office for a follow-up appointment approximately 2 weeks after your surgery. b. Make sure that you call for this appointment the day you arrive home to insure a convenient appointment time.  9. If you have disability or family leave forms that need to be completed, you may have them completed by your primary care physician's office; for return to work instructions, please ask our office staff and they will be  happy to assist you in obtaining this documentation   When to call us [phone]: 1. Poor pain control 2. Reactions / problems with new medications (rash/itching, etc)  3. Fever over 101.5 F (38.5 C) 4. Inability to urinate 5. Nausea/vomiting 6. Worsening swelling or bruising 7. Continued bleeding from incision. 8. Increased pain, redness, or drainage from the incision  The clinic staff is available to answer your questions during regular business hours (8:30am-5pm).  Please dont hesitate to call and ask to speak to one of our nurses for clinical concerns.   A surgeon from [facility] is always on call at the hospitals   If you have a medical emergency, go to the nearest emergency room or call 911.  [facility], [place] [address] [place], [place], [place]  [address] MAIN: [phone] FAX: [phone] [email or web address]

## 2017-07-12 ENCOUNTER — Ambulatory Visit: Admit: 2017-07-12 | Payer: BC Managed Care – PPO | Admitting: Surgery

## 2017-07-12 SURGERY — REPAIR, HERNIA, UMBILICAL, ADULT
Anesthesia: General

## 2018-02-14 DIAGNOSIS — Z8616 Personal history of COVID-19: Secondary | ICD-10-CM

## 2018-02-14 HISTORY — DX: Personal history of COVID-19: Z86.16

## 2019-01-28 ENCOUNTER — Other Ambulatory Visit: Payer: Self-pay | Admitting: Obstetrics & Gynecology

## 2019-01-28 DIAGNOSIS — R928 Other abnormal and inconclusive findings on diagnostic imaging of breast: Secondary | ICD-10-CM

## 2019-01-29 ENCOUNTER — Ambulatory Visit
Admission: RE | Admit: 2019-01-29 | Discharge: 2019-01-29 | Disposition: A | Discharge status: Home / Self Care | Payer: BC Managed Care – PPO | Source: Ambulatory Visit | Attending: Obstetrics & Gynecology | Admitting: Obstetrics & Gynecology

## 2019-01-29 ENCOUNTER — Other Ambulatory Visit: Payer: Self-pay | Admitting: Obstetrics & Gynecology

## 2019-01-29 ENCOUNTER — Other Ambulatory Visit: Payer: Self-pay

## 2019-01-29 DIAGNOSIS — R928 Other abnormal and inconclusive findings on diagnostic imaging of breast: Secondary | ICD-10-CM

## 2019-01-29 DIAGNOSIS — N6310 Unspecified lump in the right breast, unspecified quadrant: Secondary | ICD-10-CM

## 2019-03-20 ENCOUNTER — Other Ambulatory Visit: Payer: Self-pay

## 2019-03-20 DIAGNOSIS — Z20822 Contact with and (suspected) exposure to covid-19: Secondary | ICD-10-CM

## 2019-03-23 ENCOUNTER — Telehealth: Payer: Self-pay | Admitting: *Deleted

## 2019-03-23 ENCOUNTER — Ambulatory Visit: Payer: Self-pay

## 2019-03-23 LAB — NOVEL CORONAVIRUS, NAA: SARS-CoV-2, NAA: DETECTED — AB

## 2019-03-23 NOTE — Telephone Encounter (Signed)
Pt calling for covid results, active. Aware not resulted as of yet.

## 2019-03-23 NOTE — Telephone Encounter (Signed)
Results  arent ready yet.

## 2019-05-05 ENCOUNTER — Other Ambulatory Visit: Payer: BC Managed Care – PPO

## 2019-05-15 ENCOUNTER — Other Ambulatory Visit: Payer: Self-pay

## 2019-05-15 ENCOUNTER — Ambulatory Visit
Admission: RE | Admit: 2019-05-15 | Discharge: 2019-05-15 | Disposition: A | Discharge status: Home / Self Care | Payer: BC Managed Care – PPO | Source: Ambulatory Visit | Attending: Obstetrics & Gynecology | Admitting: Obstetrics & Gynecology

## 2019-05-15 ENCOUNTER — Other Ambulatory Visit: Payer: Self-pay | Admitting: Obstetrics & Gynecology

## 2019-05-15 DIAGNOSIS — N6310 Unspecified lump in the right breast, unspecified quadrant: Secondary | ICD-10-CM

## 2019-05-15 DIAGNOSIS — R928 Other abnormal and inconclusive findings on diagnostic imaging of breast: Secondary | ICD-10-CM

## 2019-08-14 ENCOUNTER — Ambulatory Visit
Admission: RE | Admit: 2019-08-14 | Discharge: 2019-08-14 | Disposition: A | Discharge status: Home / Self Care | Payer: BC Managed Care – PPO | Source: Ambulatory Visit | Attending: Obstetrics & Gynecology | Admitting: Obstetrics & Gynecology

## 2019-08-14 ENCOUNTER — Other Ambulatory Visit: Payer: Self-pay | Admitting: Obstetrics & Gynecology

## 2019-08-14 ENCOUNTER — Other Ambulatory Visit: Payer: Self-pay

## 2019-08-14 DIAGNOSIS — N6310 Unspecified lump in the right breast, unspecified quadrant: Secondary | ICD-10-CM

## 2019-08-24 ENCOUNTER — Other Ambulatory Visit: Payer: Self-pay

## 2019-08-24 ENCOUNTER — Ambulatory Visit
Admission: RE | Admit: 2019-08-24 | Discharge: 2019-08-24 | Disposition: A | Discharge status: Home / Self Care | Payer: BC Managed Care – PPO | Source: Ambulatory Visit | Attending: Obstetrics & Gynecology | Admitting: Obstetrics & Gynecology

## 2019-08-24 DIAGNOSIS — N6310 Unspecified lump in the right breast, unspecified quadrant: Secondary | ICD-10-CM

## 2021-03-14 ENCOUNTER — Encounter (HOSPITAL_BASED_OUTPATIENT_CLINIC_OR_DEPARTMENT_OTHER): Payer: Self-pay | Admitting: Obstetrics and Gynecology

## 2021-03-14 ENCOUNTER — Other Ambulatory Visit: Payer: Self-pay

## 2021-03-14 DIAGNOSIS — Z974 Presence of external hearing-aid: Secondary | ICD-10-CM

## 2021-03-14 DIAGNOSIS — Z01812 Encounter for preprocedural laboratory examination: Secondary | ICD-10-CM | POA: Diagnosis present

## 2021-03-14 DIAGNOSIS — I10 Essential (primary) hypertension: Secondary | ICD-10-CM | POA: Diagnosis not present

## 2021-03-14 DIAGNOSIS — D219 Benign neoplasm of connective and other soft tissue, unspecified: Secondary | ICD-10-CM | POA: Diagnosis not present

## 2021-03-14 HISTORY — DX: Presence of external hearing-aid: Z97.4

## 2021-03-14 NOTE — Progress Notes (Addendum)
Spoke w/ via phone for pre-op interview---pt Lab needs dos----  serum preg             Lab results------lab appt 03-17-2021 for cbc bmp t & s, ekg 09-16-2020 [facility] on chart COVID test -----03-17-2021 had flu 03-10-2021 Arrive at -------530 am 03-21-2021 NPO after MN NO Solid Food.  Clear liquids from MN until---430 am Med rec completed Medications to take morning of surgery -----flonase prn and bring flonase Diabetic medication -----n/a Patient instructed no nail polish to be worn day of surgery Patient instructed to bring photo id and insurance card day of surgery Patient aware to have Driver (ride ) / caregiver    for 24 hours after surgery  chris spouse Patient Special Instructions -----pt given overnight stay instructions Pre-Op special Istructions -----n/a Patient verbalized understanding of instructions that were given at this phone interview. Patient denies shortness of breath, chest pain, fever, cough at this phone interview.

## 2021-03-14 NOTE — Progress Notes (Addendum)
YOU ARE SCHEDULED FOR A COVID TEST ON    03-17-2021 . THIS TEST MUST BE DONE BEFORE SURGERY. GO TO  [facility] @ [place]   PHONE NUMBER [phone].   TURN LEFT AT THE SHIPPING AND RECEIVING SIGN AND LOOK FOR SMALL BLUE POP UP TENT UNDER BUILDING OVERHANG AT BACK OF BUILDING AND REMAIN IN YOUR CAR, THIS IS A DRIVE UP TEST.  AFTER YOUR COVID TEST , PLEASE WEAR A MASK OUT IN PUBLIC AND SOCIAL DISTANCE AND [place] YOUR HANDS FREQUENTLY. PLEASE ASK ALL YOUR CLOSE HOUSEHOLD CONTACT TO WEAR MASK OUT IN PUBLIC AND SOCIAL DISTANCE AND [place] HANDS FREQUENTLY ALSO.      Your procedure is scheduled on 03-21-2021  Report to [facility]   Call this number if you have problems the morning of surgery  :[phone].   OUR ADDRESS IS [place].  WE ARE LOCATED IN [facility].  PLEASE BRING YOUR INSURANCE CARD AND PHOTO ID DAY OF SURGERY.  ONLY ONE PERSON ALLOWED IN FACILITY WAITING AREA.                                     REMEMBER:  DO NOT EAT FOOD, CANDY GUM OR MINTS  AFTER MIDNIGHT . YOU MAY HAVE CLEAR LIQUIDS FROM MIDNIGHT UNTIL 430 AM. NO CLEAR LIQUIDS AFTER  430 AM DAY OF SURGERY.   YOU MAY  BRUSH YOUR TEETH MORNING OF SURGERY AND RINSE YOUR MOUTH OUT, NO CHEWING GUM CANDY OR MINTS.    CLEAR LIQUID DIET   Foods Allowed                                                                     Foods Excluded  Coffee and tea, regular and decaf                             liquids that you cannot  Plain Jell-O any favor except red or purple                                           see through such as: Fruit ices (not with fruit pulp)                                     milk, soups, orange juice  Iced Popsicles                                    All solid food Carbonated beverages, regular and diet                                    Cranberry, grape and apple juices Sports drinks like Gatorade Lightly  seasoned  clear broth or consume(fat free) Sugar  Sample Menu Breakfast                                Lunch                                     Supper Cranberry juice                                           Jell-O                                     Grape juice                           Apple juice Coffee or tea                        Jell-O                                      Popsicle                                                Coffee or tea                        Coffee or tea  _____________________________________________________________________     TAKE THESE MEDICATIONS MORNING OF SURGERY WITH A SIP OF WATER:  FLONASE NASAL SPRAY IF NEEDED AND BRING FLONASE WITH YOU DAY OF SURGERY.  ONE VISITOR IS ALLOWED IN WAITING ROOM ONLY DAY OF SURGERY.  YOU MAY HAVE ANOTHER PERSON SWITCH OUT WITH THE  1  VISITOR IN THE WAITING ROOM DAY OF SURGERY AND A MASK MUST BE WORN IN THE WAITING ROOM.    2 VISITORS  MAY VISIT IN THE EXTENDED RECOVERY ROOM UNTIL 800 PM ONLY 1 VISITOR AGE 57 AND OVER MAY SPEND THE NIGHT AND MUST BE IN EXTENDED RECOVERY ROOM NO LATER THAN 800 PM .   UP TO 2 CHILDREN AGE 75 TO 15 MAY ALSO VISIT IN EXTENDED RECOVERY ROOM ONLY UNTIL 800 PM AND MUST LEAVE BY 800 PM. ALL PERSONS VISITING IN EXTENDED RECOVERY ROOM MUST WEAR A MASK.                                    DO NOT WEAR JEWERLY, MAKE UP. DO NOT WEAR LOTIONS, POWDERS, PERFUMES OR NAIL POLISH ON YOUR FINGERNAILS. TOENAIL POLISH IS OK TO WEAR. DO NOT SHAVE FOR 48 HOURS PRIOR TO DAY OF SURGERY. MEN MAY SHAVE FACE AND NECK. CONTACTS, GLASSES, OR DENTURES MAY NOT BE WORN TO SURGERY.  [facility] IS NOT RESPONSIBLE  FOR ANY BELONGINGS.                                                                    Marland Kitchen           [facility] - Preparing for Surgery Before surgery, you can play an important role.  Because skin is not sterile, your skin needs to be as free of germs as possible.  You can  reduce the number of germs on your skin by washing with CHG (chlorahexidine gluconate) soap before surgery.  CHG is an antiseptic cleaner which kills germs and bonds with the skin to continue killing germs even after washing. Please DO NOT use if you have an allergy to CHG or antibacterial soaps.  If your skin becomes reddened/irritated stop using the CHG and inform your nurse when you arrive at Short Stay. Do not shave (including legs and underarms) for at least 48 hours prior to the first CHG shower.  You may shave your face/neck. Please follow these instructions carefully:  1.  Shower with CHG Soap the night before surgery and the  morning of Surgery.  2.  If you choose to wash your hair, wash your hair first as usual with your  normal  shampoo.  3.  After you shampoo, rinse your hair and body thoroughly to remove the  shampoo.                            4.  Use CHG as you would any other liquid soap.  You can apply chg directly  to the skin and wash                      Gently with a scrungie or clean washcloth.  5.  Apply the CHG Soap to your body ONLY FROM THE NECK DOWN.   Do not use on face/ open                           Wound or open sores. Avoid contact with eyes, ears mouth and genitals (private parts).                       Wash face,  Genitals (private parts) with your normal soap.             6.  Wash thoroughly, paying special attention to the area where your surgery  will be performed.  7.  Thoroughly rinse your body with warm water from the neck down.  8.  DO NOT shower/wash with your normal soap after using and rinsing off  the CHG Soap.                9.  Pat yourself dry with a clean towel.            10.  Wear clean pajamas.            11.  Place clean sheets on your bed the night of your first shower and do not  sleep with pets. Day of Surgery : Do not apply any lotions/deodorants the morning of surgery.  Please wear clean clothes to the hospital/surgery center.  IF YOU  HAVE ANY SKIN IRRITATION OR PROBLEMS WITH THE SURGICAL SOAP, PLEASE GET A BAR OF GOLD DIAL SOAP AND SHOWER THE NIGHT BEFORE YOUR SURGERY AND THE MORNING OF YOUR SURGERY. PLEASE LET THE NURSE KNOW MORNING OF YOUR SURGERY IF YOU HAD ANY PROBLEMS WITH THE SURGICAL SOAP.  FAILURE TO FOLLOW THESE INSTRUCTIONS MAY RESULT IN THE CANCELLATION OF YOUR SURGERY PATIENT SIGNATURE_________________________________  NURSE SIGNATURE__________________________________  ________________________________________________________________________                                                        QUESTIONS Hansel Feinstein PRE OP NURSE PHONE [phone].

## 2021-03-17 ENCOUNTER — Other Ambulatory Visit: Payer: Self-pay | Admitting: Obstetrics and Gynecology

## 2021-03-17 ENCOUNTER — Encounter (HOSPITAL_COMMUNITY)
Admission: RE | Admit: 2021-03-17 | Discharge: 2021-03-17 | Disposition: A | Discharge status: Home / Self Care | Payer: BC Managed Care – PPO | Source: Ambulatory Visit | Attending: Obstetrics and Gynecology | Admitting: Obstetrics and Gynecology

## 2021-03-17 ENCOUNTER — Other Ambulatory Visit: Payer: Self-pay

## 2021-03-17 DIAGNOSIS — D219 Benign neoplasm of connective and other soft tissue, unspecified: Secondary | ICD-10-CM | POA: Insufficient documentation

## 2021-03-17 DIAGNOSIS — Z01812 Encounter for preprocedural laboratory examination: Secondary | ICD-10-CM | POA: Diagnosis not present

## 2021-03-17 DIAGNOSIS — I10 Essential (primary) hypertension: Secondary | ICD-10-CM | POA: Insufficient documentation

## 2021-03-17 LAB — CBC
HCT: 33.9 % — ABNORMAL LOW (ref 36.0–46.0)
Hemoglobin: 10.9 g/dL — ABNORMAL LOW (ref 12.0–15.0)
MCH: 27.5 pg (ref 26.0–34.0)
MCHC: 32.2 g/dL (ref 30.0–36.0)
MCV: 85.4 fL (ref 80.0–100.0)
Platelets: 385 10*3/uL (ref 150–400)
RBC: 3.97 MIL/uL (ref 3.87–5.11)
RDW: 13.3 % (ref 11.5–15.5)
WBC: 9.1 10*3/uL (ref 4.0–10.5)
nRBC: 0 % (ref 0.0–0.2)

## 2021-03-17 LAB — BASIC METABOLIC PANEL
Anion gap: 11 (ref 5–15)
BUN: 9 mg/dL (ref 6–20)
CO2: 24 mmol/L (ref 22–32)
Calcium: 8.7 mg/dL — ABNORMAL LOW (ref 8.9–10.3)
Chloride: 99 mmol/L (ref 98–111)
Creatinine, Ser: 0.68 mg/dL (ref 0.44–1.00)
GFR, Estimated: >60 mL/min (ref >60)
Glucose, Bld: 98 mg/dL (ref 70–99)
Potassium: 3.5 mmol/L (ref 3.5–5.1)
Sodium: 134 mmol/L — ABNORMAL LOW (ref 135–145)

## 2021-03-17 LAB — SARS CORONAVIRUS 2 (TAT 6-24 HRS): SARS Coronavirus 2: NEGATIVE

## 2021-03-20 NOTE — Anesthesia Preprocedure Evaluation (Addendum)
Anesthesia Evaluation  Patient identified by MRN, date of birth, ID band Patient awake    Reviewed: Allergy & Precautions, H&P , NPO status , Patient's Chart, lab work & pertinent test results  Airway Mallampati: II  TM Distance: >3 FB Neck ROM: Full    Dental no notable dental hx.    Pulmonary neg pulmonary ROS,    Pulmonary exam normal breath sounds clear to auscultation       Cardiovascular hypertension, Pt. on medications Normal cardiovascular exam Rhythm:Regular Rate:Normal     Neuro/Psych negative neurological ROS  negative psych ROS   GI/Hepatic negative GI ROS, Neg liver ROS,   Endo/Other  negative endocrine ROS  Renal/GU negative Renal ROS  negative genitourinary   Musculoskeletal negative musculoskeletal ROS (+)   Abdominal   Peds negative pediatric ROS (+)  Hematology negative hematology ROS (+)   Anesthesia Other Findings   Reproductive/Obstetrics negative OB ROS                            Anesthesia Physical Anesthesia Plan  ASA: 2  Anesthesia Plan: General   Post-op Pain Management:    Induction: Intravenous  PONV Risk Score and Plan: 3 and Ondansetron, Dexamethasone, Midazolam, Treatment may vary due to age or medical condition and Scopolamine patch - Pre-op  Airway Management Planned: Oral ETT  Additional Equipment:   Intra-op Plan:   Post-operative Plan: Extubation in OR  Informed Consent: I have reviewed the patients History and Physical, chart, labs and discussed the procedure including the risks, benefits and alternatives for the proposed anesthesia with the patient or authorized representative who has indicated his/her understanding and acceptance.     Dental advisory given  Plan Discussed with: CRNA and Surgeon  Anesthesia Plan Comments:         Anesthesia Quick Evaluation

## 2021-03-20 NOTE — H&P (Signed)
Wanda Richardson is an 45 y.o. female. She had uterine fibroids measuring 5.1 and 4.0 cm. Also recurrent bilateral simple ovarian cysts over several years. U/S 03/15/21 note Rt ovary with 5.0 cm simple cyst and Lt ovary with 3.6 cm simple cyst. No blood flow to either ovary. CA 125=46.8.She has very heavy menstrual flows that flood her bed and ruin clothes. No response to OCP.  Pertinent Gynecological History: Menses:  very heavy Bleeding: dysfunctional uterine bleeding Contraception: OCP (estrogen/progesterone) DES exposure: denies Blood transfusions: none Sexually transmitted diseases: no past history Previous GYN Procedures:  Last mammogram: normal Date: 2022 Last pap: normal Date: 2022 OB History: G2, P2   Menstrual History: Menarche age: unknown Patient's last menstrual period was 03/11/2021.    Past Medical History:  Diagnosis Date   Family history of adverse reaction to anesthesia    "mom's hard to wake up after anesthesia" (06/19/2017)   Fibroid    History of COVID-19 02/2018   mild symptoms x 4 to 5 days all symptoms resolved   History of kidney stones    Hyperlipidemia    Hypertension    Ovarian cyst    PONV (postoperative nausea and vomiting)    aftre [place] teeth extraction no problems with 2019 surgery   Wears hearing aid in both ears 03/14/2021    Past Surgical History:  Procedure Laterality Date   UMBILICAL HERNIA REPAIR N/A 06/19/2017   Procedure: HERNIA REPAIR UMBILICAL ADULT WITH POSSIBLE MESH;  Surgeon: Ileana Roup, MD;  Location: [facility];  Service: General;  Laterality: N/A;   [place] EXTRACTION  2000    History reviewed. No pertinent family history.  Social History:  reports that she has never smoked. She has never used smokeless tobacco. She reports that she does not drink alcohol and does not use drugs.  Allergies: No Known Allergies  No medications prior to admission.    Review of Systems  Constitutional:  Negative for fever.    Last menstrual period 03/11/2021. Physical Exam Cardiovascular:     Rate and Rhythm: Normal rate.  Pulmonary:     Effort: Pulmonary effort is normal.    No results found for this or any previous visit (from the past 24 hour(s)).  No results found.  Assessment/Plan: 45 yo with uterine fibroids and bilateral simple ovarian cysts TAH/BSO discussed, risks reviewed including infection, organ damage, bleeding/transfusion-HIV/Hep, DVT/PE, pneumonia, wound breakdown. Menopause also discussed.  Wanda Richardson 03/20/2021, 6:09 PM

## 2021-03-21 ENCOUNTER — Encounter (HOSPITAL_BASED_OUTPATIENT_CLINIC_OR_DEPARTMENT_OTHER): Payer: Self-pay | Admitting: Obstetrics and Gynecology

## 2021-03-21 ENCOUNTER — Inpatient Hospital Stay (HOSPITAL_BASED_OUTPATIENT_CLINIC_OR_DEPARTMENT_OTHER): Payer: BC Managed Care – PPO | Admitting: Anesthesiology

## 2021-03-21 ENCOUNTER — Other Ambulatory Visit: Payer: Self-pay

## 2021-03-21 ENCOUNTER — Encounter (HOSPITAL_BASED_OUTPATIENT_CLINIC_OR_DEPARTMENT_OTHER)
Admission: RE | Disposition: A | Discharge status: Home / Self Care | Payer: Self-pay | Source: Home / Self Care | Attending: Obstetrics and Gynecology

## 2021-03-21 ENCOUNTER — Observation Stay (HOSPITAL_BASED_OUTPATIENT_CLINIC_OR_DEPARTMENT_OTHER)
Admission: RE | Admit: 2021-03-21 | Discharge: 2021-03-22 | Disposition: A | Discharge status: Home / Self Care | Payer: BC Managed Care – PPO | Attending: Obstetrics and Gynecology | Admitting: Obstetrics and Gynecology

## 2021-03-21 DIAGNOSIS — N80203 Endometriosis of bilateral fallopian tubes, unspecified depth: Secondary | ICD-10-CM | POA: Diagnosis not present

## 2021-03-21 DIAGNOSIS — N8003 Adenomyosis of the uterus: Secondary | ICD-10-CM | POA: Insufficient documentation

## 2021-03-21 DIAGNOSIS — N83291 Other ovarian cyst, right side: Secondary | ICD-10-CM | POA: Diagnosis not present

## 2021-03-21 DIAGNOSIS — N83292 Other ovarian cyst, left side: Secondary | ICD-10-CM | POA: Insufficient documentation

## 2021-03-21 DIAGNOSIS — N840 Polyp of corpus uteri: Secondary | ICD-10-CM | POA: Insufficient documentation

## 2021-03-21 DIAGNOSIS — D259 Leiomyoma of uterus, unspecified: Secondary | ICD-10-CM | POA: Diagnosis present

## 2021-03-21 DIAGNOSIS — I10 Essential (primary) hypertension: Secondary | ICD-10-CM | POA: Insufficient documentation

## 2021-03-21 DIAGNOSIS — Z8616 Personal history of COVID-19: Secondary | ICD-10-CM | POA: Diagnosis not present

## 2021-03-21 DIAGNOSIS — D219 Benign neoplasm of connective and other soft tissue, unspecified: Secondary | ICD-10-CM | POA: Diagnosis present

## 2021-03-21 HISTORY — DX: Essential (primary) hypertension: I10

## 2021-03-21 HISTORY — PX: SALPINGOOPHORECTOMY: SHX82

## 2021-03-21 HISTORY — PX: ABDOMINAL HYSTERECTOMY: SHX81

## 2021-03-21 HISTORY — DX: Hyperlipidemia, unspecified: E78.5

## 2021-03-21 LAB — ABO/RH: ABO/RH(D): B POS

## 2021-03-21 LAB — HCG, SERUM, QUALITATIVE: Preg, Serum: NEGATIVE

## 2021-03-21 LAB — PREPARE RBC (CROSSMATCH)

## 2021-03-21 SURGERY — HYSTERECTOMY, ABDOMINAL
Anesthesia: General | Site: Abdomen

## 2021-03-21 MED ORDER — MIDAZOLAM HCL 5 MG/5ML IJ SOLN
INTRAMUSCULAR | Status: DC | PRN
  Administered 2021-03-21: 2 mg via INTRAVENOUS

## 2021-03-21 MED ORDER — ACETAMINOPHEN 500 MG PO TABS
ORAL_TABLET | ORAL | Status: AC
  Filled 2021-03-21: qty 2

## 2021-03-21 MED ORDER — LOSARTAN POTASSIUM 25 MG PO TABS
25.0000 mg | ORAL_TABLET | Freq: Every day | ORAL | Status: DC
  Administered 2021-03-21: 25 mg via ORAL
  Filled 2021-03-21 (×2): qty 1

## 2021-03-21 MED ORDER — OXYCODONE HCL 5 MG PO TABS
ORAL_TABLET | ORAL | Status: AC
  Filled 2021-03-21: qty 1

## 2021-03-21 MED ORDER — SODIUM CHLORIDE (PF) 0.9 % IJ SOLN
INTRAMUSCULAR | Status: DC | PRN
  Administered 2021-03-21: 20 mL

## 2021-03-21 MED ORDER — ROCURONIUM BROMIDE 10 MG/ML (PF) SYRINGE
PREFILLED_SYRINGE | INTRAVENOUS | Status: DC | PRN
  Administered 2021-03-21: 70 mg via INTRAVENOUS
  Administered 2021-03-21 (×2): 10 mg via INTRAVENOUS

## 2021-03-21 MED ORDER — DEXAMETHASONE SODIUM PHOSPHATE 10 MG/ML IJ SOLN
INTRAMUSCULAR | Status: AC
  Filled 2021-03-21: qty 1

## 2021-03-21 MED ORDER — SUGAMMADEX SODIUM 200 MG/2ML IV SOLN
INTRAVENOUS | Status: DC | PRN
  Administered 2021-03-21: 200 mg via INTRAVENOUS

## 2021-03-21 MED ORDER — ROCURONIUM BROMIDE 10 MG/ML (PF) SYRINGE
PREFILLED_SYRINGE | INTRAVENOUS | Status: AC
  Filled 2021-03-21: qty 10

## 2021-03-21 MED ORDER — LACTATED RINGERS IV SOLN: INTRAVENOUS | Status: DC

## 2021-03-21 MED ORDER — ONDANSETRON HCL 4 MG/2ML IJ SOLN
INTRAMUSCULAR | Status: AC
  Filled 2021-03-21: qty 2

## 2021-03-21 MED ORDER — HEMOSTATIC AGENTS (NO CHARGE) OPTIME
TOPICAL | Status: DC | PRN
  Administered 2021-03-21: 1

## 2021-03-21 MED ORDER — POVIDONE-IODINE 10 % EX SWAB: 2.0000 "application " | Freq: Once | CUTANEOUS | Status: DC

## 2021-03-21 MED ORDER — ACETAMINOPHEN 500 MG PO TABS
1000.0000 mg | ORAL_TABLET | Freq: Four times a day (QID) | ORAL | Status: DC
  Administered 2021-03-21 – 2021-03-22 (×4): 1000 mg via ORAL

## 2021-03-21 MED ORDER — DOCUSATE SODIUM 100 MG PO CAPS
ORAL_CAPSULE | ORAL | Status: AC
  Filled 2021-03-21: qty 1

## 2021-03-21 MED ORDER — CEFAZOLIN SODIUM-DEXTROSE 2-4 GM/100ML-% IV SOLN
INTRAVENOUS | Status: AC
  Filled 2021-03-21: qty 100

## 2021-03-21 MED ORDER — ONDANSETRON HCL 4 MG PO TABS: 4.0000 mg | ORAL_TABLET | Freq: Four times a day (QID) | ORAL | Status: DC | PRN

## 2021-03-21 MED ORDER — POLYETHYLENE GLYCOL 3350 17 G PO PACK: 17.0000 g | PACK | Freq: Every day | ORAL | Status: DC | PRN

## 2021-03-21 MED ORDER — IBUPROFEN 200 MG PO TABS
600.0000 mg | ORAL_TABLET | Freq: Four times a day (QID) | ORAL | Status: DC | PRN
  Administered 2021-03-21: 600 mg via ORAL

## 2021-03-21 MED ORDER — KETOROLAC TROMETHAMINE 30 MG/ML IJ SOLN
INTRAMUSCULAR | Status: AC
  Filled 2021-03-21: qty 1

## 2021-03-21 MED ORDER — PROPOFOL 10 MG/ML IV BOLUS
INTRAVENOUS | Status: AC
  Filled 2021-03-21: qty 20

## 2021-03-21 MED ORDER — PROMETHAZINE HCL 25 MG/ML IJ SOLN
6.2500 mg | INTRAMUSCULAR | Status: DC | PRN
Start: 2021-03-21 — End: 2021-03-22

## 2021-03-21 MED ORDER — PANTOPRAZOLE SODIUM 40 MG IV SOLR
INTRAVENOUS | Status: AC
  Filled 2021-03-21: qty 40

## 2021-03-21 MED ORDER — DEXAMETHASONE SODIUM PHOSPHATE 10 MG/ML IJ SOLN
INTRAMUSCULAR | Status: DC | PRN
  Administered 2021-03-21: 10 mg via INTRAVENOUS

## 2021-03-21 MED ORDER — HYDROMORPHONE HCL 1 MG/ML IJ SOLN: 0.2000 mg | INTRAMUSCULAR | Status: DC | PRN

## 2021-03-21 MED ORDER — FENTANYL CITRATE (PF) 100 MCG/2ML IJ SOLN
INTRAMUSCULAR | Status: AC
  Filled 2021-03-21: qty 2

## 2021-03-21 MED ORDER — PROPOFOL 10 MG/ML IV BOLUS
INTRAVENOUS | Status: DC | PRN
  Administered 2021-03-21: 180 mg via INTRAVENOUS

## 2021-03-21 MED ORDER — OXYCODONE HCL 5 MG PO TABS
5.0000 mg | ORAL_TABLET | ORAL | Status: DC | PRN
  Administered 2021-03-21 – 2021-03-22 (×6): 5 mg via ORAL

## 2021-03-21 MED ORDER — ONDANSETRON HCL 4 MG/2ML IJ SOLN
INTRAMUSCULAR | Status: DC | PRN
  Administered 2021-03-21: 4 mg via INTRAVENOUS

## 2021-03-21 MED ORDER — OXYCODONE HCL 5 MG PO TABS: 5.0000 mg | ORAL_TABLET | Freq: Once | ORAL | Status: DC | PRN

## 2021-03-21 MED ORDER — HYDROMORPHONE HCL 1 MG/ML IJ SOLN
INTRAMUSCULAR | Status: AC
  Filled 2021-03-21: qty 1

## 2021-03-21 MED ORDER — SCOPOLAMINE 1 MG/3DAYS TD PT72
MEDICATED_PATCH | TRANSDERMAL | Status: DC | PRN
  Administered 2021-03-21: 1 via TRANSDERMAL

## 2021-03-21 MED ORDER — HYDROMORPHONE HCL 1 MG/ML IJ SOLN
0.2500 mg | INTRAMUSCULAR | Status: DC | PRN
Start: 2021-03-21 — End: 2021-03-22

## 2021-03-21 MED ORDER — FLUTICASONE PROPIONATE 50 MCG/ACT NA SUSP
1.0000 | NASAL | Status: DC | PRN
  Filled 2021-03-21: qty 16

## 2021-03-21 MED ORDER — SOD CITRATE-CITRIC ACID 500-334 MG/5ML PO SOLN: 30.0000 mL | ORAL | Status: DC

## 2021-03-21 MED ORDER — MIDAZOLAM HCL 2 MG/2ML IJ SOLN
INTRAMUSCULAR | Status: AC
  Filled 2021-03-21: qty 2

## 2021-03-21 MED ORDER — 0.9 % SODIUM CHLORIDE (POUR BTL) OPTIME
TOPICAL | Status: DC | PRN
  Administered 2021-03-21: 2000 mL

## 2021-03-21 MED ORDER — ONDANSETRON HCL 4 MG/2ML IJ SOLN: 4.0000 mg | Freq: Four times a day (QID) | INTRAMUSCULAR | Status: DC | PRN

## 2021-03-21 MED ORDER — IBUPROFEN 200 MG PO TABS
ORAL_TABLET | ORAL | Status: AC
  Filled 2021-03-21: qty 3

## 2021-03-21 MED ORDER — DOCUSATE SODIUM 100 MG PO CAPS
100.0000 mg | ORAL_CAPSULE | Freq: Two times a day (BID) | ORAL | Status: DC
  Administered 2021-03-21 (×2): 100 mg via ORAL

## 2021-03-21 MED ORDER — KETOROLAC TROMETHAMINE 30 MG/ML IJ SOLN
30.0000 mg | Freq: Once | INTRAMUSCULAR | Status: AC | PRN
  Administered 2021-03-21: 30 mg via INTRAVENOUS

## 2021-03-21 MED ORDER — OXYCODONE HCL 5 MG/5ML PO SOLN: 5.0000 mg | Freq: Once | ORAL | Status: DC | PRN

## 2021-03-21 MED ORDER — FENTANYL CITRATE (PF) 250 MCG/5ML IJ SOLN
INTRAMUSCULAR | Status: DC | PRN
  Administered 2021-03-21: 25 ug via INTRAVENOUS
  Administered 2021-03-21 (×3): 50 ug via INTRAVENOUS
  Administered 2021-03-21: 100 ug via INTRAVENOUS
  Administered 2021-03-21: 25 ug via INTRAVENOUS

## 2021-03-21 MED ORDER — CEFAZOLIN SODIUM-DEXTROSE 2-4 GM/100ML-% IV SOLN
2.0000 g | INTRAVENOUS | Status: AC
  Administered 2021-03-21: 2 g via INTRAVENOUS

## 2021-03-21 MED ORDER — LIDOCAINE 2% (20 MG/ML) 5 ML SYRINGE
INTRAMUSCULAR | Status: AC
  Filled 2021-03-21: qty 5

## 2021-03-21 MED ORDER — SIMETHICONE 80 MG PO CHEW: 80.0000 mg | CHEWABLE_TABLET | Freq: Four times a day (QID) | ORAL | Status: DC | PRN

## 2021-03-21 MED ORDER — PANTOPRAZOLE SODIUM 40 MG IV SOLR
40.0000 mg | Freq: Every day | INTRAVENOUS | Status: DC
  Administered 2021-03-21: 40 mg via INTRAVENOUS

## 2021-03-21 MED ORDER — LIDOCAINE 2% (20 MG/ML) 5 ML SYRINGE
INTRAMUSCULAR | Status: DC | PRN
  Administered 2021-03-21: 60 mg via INTRAVENOUS

## 2021-03-21 MED ORDER — BUPIVACAINE LIPOSOME 1.3 % IJ SUSP
INTRAMUSCULAR | Status: DC | PRN
  Administered 2021-03-21: 20 mL

## 2021-03-21 SURGICAL SUPPLY — 32 items
APL PRP STRL LF DISP 70% ISPRP (MISCELLANEOUS) ×2
APL SKNCLS STERI-STRIP NONHPOA (GAUZE/BANDAGES/DRESSINGS) ×2
BENZOIN TINCTURE PRP APPL 2/3 (GAUZE/BANDAGES/DRESSINGS) ×1 IMPLANT
BLADE EXTENDED COATED 6.5IN (ELECTRODE) IMPLANT
CHLORAPREP W/TINT 26 (MISCELLANEOUS) ×3 IMPLANT
CLSR STERI-STRIP ANTIMIC 1/2X4 (GAUZE/BANDAGES/DRESSINGS) ×1 IMPLANT
DECANTER SPIKE VIAL GLASS SM (MISCELLANEOUS) IMPLANT
DRAPE WARM FLUID 44X44 (DRAPES) ×3 IMPLANT
DRSG OPSITE POSTOP 4X10 (GAUZE/BANDAGES/DRESSINGS) ×3 IMPLANT
DRSG PAD ABDOMINAL 8X10 ST (GAUZE/BANDAGES/DRESSINGS) ×2 IMPLANT
GLOVE SURG ENC MOIS LTX SZ8 (GLOVE) ×6 IMPLANT
GOWN STRL REUS W/TWL LRG LVL3 (GOWN DISPOSABLE) ×6 IMPLANT
HEMOSTAT ARISTA ABSORB 3G PWDR (HEMOSTASIS) ×1 IMPLANT
KIT TURNOVER CYSTO (KITS) ×3 IMPLANT
LIGASURE IMPACT 36 18CM CVD LR (INSTRUMENTS) IMPLANT
NS IRRIG 500ML POUR BTL (IV SOLUTION) ×9 IMPLANT
PACK ABDOMINAL GYN (CUSTOM PROCEDURE TRAY) ×3 IMPLANT
PAD OB MATERNITY 4.3X12.25 (PERSONAL CARE ITEMS) ×3 IMPLANT
SPONGE T-LAP 18X18 ~~LOC~~+RFID (SPONGE) IMPLANT
STRIP CLOSURE SKIN 1/4X4 (GAUZE/BANDAGES/DRESSINGS) IMPLANT
SUT MNCRL 0 MO-4 VIOLET 18 CR (SUTURE) ×2 IMPLANT
SUT MNCRL 0 VIOLET 6X18 (SUTURE) ×2 IMPLANT
SUT MNCRL AB 0 CT1 27 (SUTURE) ×3 IMPLANT
SUT MONOCRYL 0 6X18 (SUTURE) ×3
SUT MONOCRYL 0 MO 4 18  CR/8 (SUTURE) ×6
SUT PDS AB 0 CTX 60 (SUTURE) ×6 IMPLANT
SUT PLAIN 2 0 XLH (SUTURE) ×4 IMPLANT
SUT VIC AB 4-0 KS 27 (SUTURE) ×3 IMPLANT
SYR BULB IRRIG 60ML STRL (SYRINGE) ×3 IMPLANT
TOWEL OR 17X26 10 PK STRL BLUE (TOWEL DISPOSABLE) ×3 IMPLANT
TRAY FOLEY W/BAG SLVR 14FR LF (SET/KITS/TRAYS/PACK) ×3 IMPLANT
WATER STERILE IRR 500ML POUR (IV SOLUTION) ×3 IMPLANT

## 2021-03-21 NOTE — Progress Notes (Signed)
03/21/2021  9:43 AM  PATIENT:  Wanda Richardson  45 y.o. female  PRE-OPERATIVE DIAGNOSIS:  FIBROIDS  POST-OPERATIVE DIAGNOSIS:  FIBROIDS  PROCEDURE:  Procedure(s): SUPRA CERVICAL ABDOMINAL HYSTERECTOMY (N/A) BILATERAL SALPINGO OOPHORECTOMY (Bilateral)  SURGEON:  Surgeon(s) and Role:    * Everlene Farrier, MD - Primary    * Maisie Fus, MD - Assisting  PHYSICIAN ASSISTANT:   ASSISTANTS: none   ANESTHESIA:   general  EBL:  500 mL   BLOOD ADMINISTERED:none  DRAINS: Urinary Catheter (Foley)   LOCAL MEDICATIONS USED:  Amount: 20 ml and OTHER Exparel  SPECIMEN:  Source of Specimen:  uterine fundus, bilateral tubes/ovaries  DISPOSITION OF SPECIMEN:  PATHOLOGY  COUNTS:  YES  TOURNIQUET:  * No tourniquets in log *  DICTATION: .Other Dictation: Dictation Number [id number]  PLAN OF CARE: Admit to inpatient   PATIENT DISPOSITION:  PACU - hemodynamically stable.   Delay start of Pharmacological VTE agent (>24hrs) due to surgical blood loss or risk of bleeding: not applicable

## 2021-03-21 NOTE — Anesthesia Procedure Notes (Signed)
Procedure Name: Intubation Date/Time: 03/21/2021 7:38 AM Performed by: Viviann Broyles D, CRNA Pre-anesthesia Checklist: Patient identified, Emergency Drugs available, Suction available and Patient being monitored Patient Re-evaluated:Patient Re-evaluated prior to induction Oxygen Delivery Method: Circle system utilized Preoxygenation: Pre-oxygenation with 100% oxygen Induction Type: IV induction Ventilation: Mask ventilation without difficulty Laryngoscope Size: Mac and 4 Grade View: Grade I Tube type: Oral Number of attempts: 1 Airway Equipment and Method: Stylet Placement Confirmation: ETT inserted through vocal cords under direct vision, positive ETCO2 and breath sounds checked- equal and bilateral Secured at: 21 cm Tube secured with: Tape Dental Injury: Teeth and Oropharynx as per pre-operative assessment

## 2021-03-21 NOTE — Transfer of Care (Signed)
Immediate Anesthesia Transfer of Care Note  Patient: EMIAH PELLICANO  Procedure(s) Performed: SUPRA CERVICAL ABDOMINAL HYSTERECTOMY (Abdomen) BILATERAL SALPINGO OOPHORECTOMY (Bilateral: Abdomen)  Patient Location: PACU  Anesthesia Type:General  Level of Consciousness: awake, alert  and oriented  Airway & Oxygen Therapy: Patient Spontanous Breathing and Patient connected to face mask oxygen  Post-op Assessment: Report given to RN and Post -op Vital signs reviewed and stable  Post vital signs: Reviewed and stable  Last Vitals:  Vitals Value Taken Time  BP 178/90 03/21/21 0945  Temp    Pulse 77 03/21/21 0950  Resp 25 03/21/21 0950  SpO2 91 % 03/21/21 0950  Vitals shown include unvalidated device data.  Last Pain:  Vitals:   03/21/21 7416  TempSrc: Oral  PainSc: 0-No pain      Patients Stated Pain Goal: 4 (38/45/36 4680)  Complications: No notable events documented.

## 2021-03-21 NOTE — Op Note (Signed)
Wanda Richardson, Wanda Richardson MEDICAL RECORD NO: [id number] ACCOUNT NO: [id number] DATE OF BIRTH: Dec 29, 1975 FACILITY: [facility] LOCATION: [facility] PHYSICIAN: Daleen Bo. Lyn Hollingshead, MD  Operative Report   DATE OF PROCEDURE: 03/21/2021  PREOPERATIVE DIAGNOSES:   1.  Uterine leiomyomata. 2.  Bilateral ovarian cyst. 3.  Pelvic adhesions.  POSTOPERATIVE DIAGNOSES: 1.  Uterine leiomyomata. 2.  Bilateral ovarian cyst. 3.  Pelvic adhesions.  PROCEDURE:  Abdominal supracervical hysterectomy and bilateral salpingo-oophorectomy.  SURGEON:  Daleen Bo. Lyn Hollingshead, MD  ASSISTANT:  Evette Cristal, MD  ANESTHESIA:  General with endotracheal intubation.  ESTIMATED BLOOD LOSS:  500 mL.  SPECIMENS:  Uterine fundus, bilateral tubes and ovaries to pathology.  Weight of specimen is 534 grams.  INDICATIONS AND CONSENT:  This patient is a 45 year old married white female with a multiyear history of recurrent painful ovarian cysts and now uterine fibroids with heavy menses.  Details are dictated in the history and physical.  Total abdominal  hysterectomy with bilateral salpingo-oophorectomy has been discussed preoperatively.  Potential risks and complications were discussed preoperatively including but not limited to infection, organ damage, bleeding requiring transfusion of blood products  with HIV and hepatitis acquisition, DVT, PE, pneumonia, wound breakdown.  Issues of menopause have been discussed as well.  The patient states she understands and agrees and consent is signed on the chart.  FINDINGS:  Uterus is 14 weeks in size.  There are several filmy adhesions of the epiploicae to the uterine fundus.  DESCRIPTION OF PROCEDURE:  The left ovary is free and the right ovary is adherent with multiple filmy adhesions again to the epiploicae.  Left ovary contains a 3-4 cm smooth translucent cyst.  The right ovary has a 6 cm smooth translucent cyst.  The  posterior cul-de-sac was totally obliterated with dense  adhesions.  DESCRIPTION OF PROCEDURE:  The patient was taken to the operating room where she is identified, placed in the dorsal supine position and general anesthesia was induced via endotracheal intubation.  She was prepped vaginally.  Foley catheter was placed  and she was prepped abdominally.  She was prepped abdominally with ChloraPrep.  A timeout undertaken and after 3-minute drying time she was draped in a sterile fashion.  Skin was entered through a Pfannenstiel incision and dissection was carried out in  layers to the peritoneum, which was incised and extended superiorly and inferiorly.  There is large amount of retroperitoneal adipose tissue.  In order to obtain exposure the O'Sullivan-O'Connor retractor was used.  The bowel was packed away with a  single pack and the superior blade and the bladder blade are both placed.  Using traction to grasp and elevate the uterine fundus the adhesions are taken down primarily sharply under direct visualization.  This allows the fundus to be brought up into the  incision.  Then, using the LigaSure bipolar cautery and cutting instrument the proximal ligaments were taken down bilaterally down to the level of the superior branch of the uterine vessels.  The vesicouterine peritoneum was taken down and advanced.   After taking down the superior uterine arteries the fundus is delivered initially by removing about the upper half.  This allows further exposure to further advance the bladder flap investigate the posterior cul-de-sac and then take down the uterine  vessels further bilaterally.  This allows the lower section of the fundus to be safely resected with a knife.  The endocervical canal was identified and is cauterized with unipolar cautery.  The cervical stump was then oversewn with  0 Monocryl  figure-of-eights.  Lavage was carried out and good hemostasis was noted.  The left ovary and fallopian tube were then mobilized giving excellent exposure of the  infundibulopelvic ligament, which was well clear of the sidewall.  Then, using the LigaSure  device, the left infundibulopelvic ligament was taken down and the specimen was delivered.  On the right side, there are multiple filmy adhesions to the ovary and to the cyst.  These were taken down sharply under good direct visualization.  This allowed  [place] good mobilization of the right tube and ovary and good identification of the infundibulopelvic ligament, which again is well clear of the sidewall.  The LigaSure is used here to take down the infundibulopelvic ligament as well.  Inspection reveals  good hemostasis.  Lavage was again carried out and returned as clear.  Arista is placed over the vaginal cuff.  Packs were removed.  The anterior peritoneum was closed in a running fashion with 0 Monocryl suture, which is also used to reapproximate the  pyramidalis muscle in the midline.  The anterior rectus fascia was closed in running fashion with 0 looped PDS, taking generous wide bites.  Exparel 20 mL diluted in 20 mL of saline is injected both subfascially and subcutaneously throughout the  incision.  The subcutaneous layer was closed with interrupted plain suture and the skin was closed in a subcuticular fashion with 4-0 Vicryl on a Keith needle.  Benzoin, Steri-Strips and honeycomb and pressure dressings are applied.  She was awakened and  taken to recovery room in stable condition.  All counts were correct.   PUS D: 03/21/2021 9:57:26 am T: 03/21/2021 12:16:00 pm  JOB: [id number]/ [id number]

## 2021-03-21 NOTE — Anesthesia Postprocedure Evaluation (Signed)
Anesthesia Post Note  Patient: [name]  Procedure(s) Performed: SUPRA CERVICAL ABDOMINAL HYSTERECTOMY (Abdomen) BILATERAL SALPINGO OOPHORECTOMY (Bilateral: Abdomen)     Patient location during evaluation: PACU Anesthesia Type: General Level of consciousness: awake and alert Pain management: pain level controlled Vital Signs Assessment: post-procedure vital signs reviewed and stable Respiratory status: spontaneous breathing, nonlabored ventilation, respiratory function stable and patient connected to nasal cannula oxygen Cardiovascular status: blood pressure returned to baseline and stable Postop Assessment: no apparent nausea or vomiting Anesthetic complications: no   No notable events documented.  Last Vitals:  Vitals:   03/21/21 0945 03/21/21 1000  BP: (!) 178/90 (!) 169/68  Pulse: 97 77  Resp: (!) 24 (!) 26  Temp: 36.7 C   SpO2: 90% 96%    Last Pain:  Vitals:   03/21/21 1015  TempSrc:   PainSc: 3                  Jvion Turgeon S

## 2021-03-21 NOTE — Progress Notes (Signed)
Ambulating well, feels good Tolerating liquids and toast, regular tray ordered  Today's Vitals   03/21/21 1216 03/21/21 1300 03/21/21 1415 03/21/21 1517  BP:    130/74  Pulse:    86  Resp:    18  Temp:    98.1 F (36.7 C)  TempSrc:      SpO2:    95%  Weight:      Height:      PainSc: 7  Asleep 3  1    Body mass index is 41.49 kg/m.   UO dilute and clear  Abdomen soft, dressing C&D, BS +  PAS on  A/P: stable         D/C foley at patient request         Continue IV fluids         D/W surgery and findings

## 2021-03-21 NOTE — Progress Notes (Signed)
No change to H&P per patient history Reviewed procedure with patient-total abdominal hysterectomy and bilateral salpingo oophorectomy All questions answered She states she understands and agrees

## 2021-03-22 DIAGNOSIS — D259 Leiomyoma of uterus, unspecified: Secondary | ICD-10-CM | POA: Diagnosis not present

## 2021-03-22 LAB — SURGICAL PATHOLOGY

## 2021-03-22 LAB — CBC
HCT: 26.2 % — ABNORMAL LOW (ref 36.0–46.0)
Hemoglobin: 8.3 g/dL — ABNORMAL LOW (ref 12.0–15.0)
MCH: 27.3 pg (ref 26.0–34.0)
MCHC: 31.7 g/dL (ref 30.0–36.0)
MCV: 86.2 fL (ref 80.0–100.0)
Platelets: 391 10*3/uL (ref 150–400)
RBC: 3.04 MIL/uL — ABNORMAL LOW (ref 3.87–5.11)
RDW: 13.8 % (ref 11.5–15.5)
WBC: 14.2 10*3/uL — ABNORMAL HIGH (ref 4.0–10.5)
nRBC: 0 % (ref 0.0–0.2)

## 2021-03-22 MED ORDER — IBUPROFEN 600 MG PO TABS
600.0000 mg | ORAL_TABLET | Freq: Four times a day (QID) | ORAL | 0 refills | Status: DC | PRN
Start: 2021-03-22 — End: 2022-03-01

## 2021-03-22 MED ORDER — OXYCODONE HCL 5 MG PO TABS: 5.0000 mg | ORAL_TABLET | Freq: Four times a day (QID) | ORAL | 0 refills | Status: DC | PRN

## 2021-03-22 MED ORDER — OXYCODONE HCL 5 MG PO TABS
ORAL_TABLET | ORAL | Status: AC
  Filled 2021-03-22: qty 1

## 2021-03-22 MED ORDER — ACETAMINOPHEN 500 MG PO TABS: 1000.0000 mg | ORAL_TABLET | Freq: Four times a day (QID) | ORAL | 0 refills | Status: DC | PRN

## 2021-03-22 MED ORDER — ACETAMINOPHEN 500 MG PO TABS
ORAL_TABLET | ORAL | Status: AC
  Filled 2021-03-22: qty 2

## 2021-03-22 NOTE — Progress Notes (Signed)
Abd pressure dressing romoved.  Honeycomb dressing has scant amt of drainage on it.

## 2021-03-22 NOTE — Discharge Summary (Signed)
Physician Discharge Summary  Patient ID: Wanda Richardson MRN: [id number] DOB/AGE: 07/06/1975 45 y.o.  Admit date: 03/21/2021 Discharge date: 03/22/2021  Admission Diagnoses: fibroids  Discharge Diagnoses:  Principal Problem:   Fibroids   Discharged Condition: good  Hospital Course: Taken to OR and supracervical hysterectomy, BSO done. Postoperatively tolerated regular diet, voiding and ambulating without difficulty. Good pain control.  Consults: None  Significant Diagnostic Studies: labs:  Results for orders placed or performed during the hospital encounter of 03/21/21 (from the past 24 hour(s))  CBC     Status: Abnormal   Collection Time: 03/22/21  2:18 AM  Result Value Ref Range   WBC 14.2 (H) 4.0 - 10.5 K/uL   RBC 3.04 (L) 3.87 - 5.11 MIL/uL   Hemoglobin 8.3 (L) 12.0 - 15.0 g/dL   HCT 26.2 (L) 36.0 - 46.0 %   MCV 86.2 80.0 - 100.0 fL   MCH 27.3 26.0 - 34.0 pg   MCHC 31.7 30.0 - 36.0 g/dL   RDW 13.8 11.5 - 15.5 %   Platelets 391 150 - 400 K/uL   nRBC 0.0 0.0 - 0.2 %     Treatments: surgery: supracervical hysterectomy with bilateral salpingo oophorectomy  Discharge Exam: Blood pressure 118/60, pulse 68, temperature 98.6 F (37 C), resp. rate (!) 22, height 5' 5.5" (1.664 m), weight 114.9 kg, last menstrual period 03/11/2021, SpO2 97 %. General appearance: alert, cooperative, and no distress GI: soft, non-tender; bowel sounds normal; no masses,  no organomegaly  Disposition: Discharge disposition: 01-Home or Self Care        Allergies as of 03/22/2021   No Known Allergies      Medication List     STOP taking these medications    DELSYM PO   oseltamivir 45 MG capsule Commonly known as: TAMIFLU   UNKNOWN TO PATIENT       TAKE these medications    acetaminophen 500 MG tablet Commonly known as: TYLENOL Take 2 tablets (1,000 mg total) by mouth every 6 (six) hours as needed.   atorvastatin 10 MG tablet Commonly known as: LIPITOR Take 10 mg by  mouth at bedtime.   fluticasone 50 MCG/ACT nasal spray Commonly known as: FLONASE Place into both nostrils as needed for allergies or rhinitis.   ibuprofen 600 MG tablet Commonly known as: ADVIL Take 1 tablet (600 mg total) by mouth every 6 (six) hours as needed for moderate pain. What changed:  medication strength how much to take reasons to take this   losartan 25 MG tablet Commonly known as: COZAAR Take 25 mg by mouth at bedtime.   oxyCODONE 5 MG immediate release tablet Commonly known as: Oxy IR/ROXICODONE Take 1 tablet (5 mg total) by mouth every 6 (six) hours as needed for moderate pain.         Signed: [name] 03/22/2021, 8:33 AM

## 2021-03-22 NOTE — Progress Notes (Signed)
Ambulating well, tolerating regular diet, good pain control  Today's Vitals   03/21/21 2130 03/22/21 0000 03/22/21 0215 03/22/21 0615  BP: 131/69  115/60 118/60  Pulse: 81  86 68  Resp: 20  20 (!) 22  Temp: 99 F (37.2 C)  99.4 F (37.4 C) 98.6 F (37 C)  TempSrc:      SpO2: 97%  96% 97%  Weight:      Height:      PainSc: 3  2  2   0-No pain   Body mass index is 41.49 kg/m.   Abdomen soft, good BS, dressing C&D  Results for orders placed or performed during the hospital encounter of 03/21/21 (from the past 24 hour(s))  CBC     Status: Abnormal   Collection Time: 03/22/21  2:18 AM  Result Value Ref Range   WBC 14.2 (H) 4.0 - 10.5 K/uL   RBC 3.04 (L) 3.87 - 5.11 MIL/uL   Hemoglobin 8.3 (L) 12.0 - 15.0 g/dL   HCT 26.2 (L) 36.0 - 46.0 %   MCV 86.2 80.0 - 100.0 fL   MCH 27.3 26.0 - 34.0 pg   MCHC 31.7 30.0 - 36.0 g/dL   RDW 13.8 11.5 - 15.5 %   Platelets 391 150 - 400 K/uL   nRBC 0.0 0.0 - 0.2 %     A/P: Discharge home         Instructions given         FU office 1-2 weeks

## 2021-03-23 ENCOUNTER — Encounter (HOSPITAL_BASED_OUTPATIENT_CLINIC_OR_DEPARTMENT_OTHER): Payer: Self-pay | Admitting: Obstetrics and Gynecology

## 2021-03-25 LAB — TYPE AND SCREEN
ABO/RH(D): B POS
Antibody Screen: NEGATIVE
Unit division: 0
Unit division: 0

## 2021-03-25 LAB — BPAM RBC
Blood Product Expiration Date: 202212272359
Blood Product Expiration Date: 202212292359
Unit Type and Rh: 7300
Unit Type and Rh: 7300

## 2021-07-01 IMAGING — MG MM DIGITAL DIAGNOSTIC UNILAT*R* W/ TOMO W/ CAD
4 series · 6 of 12 positions shown · non-contrast
Comparison: Previous exam(s).

CLINICAL DATA: Three-month follow-up for probably benign fat
necrosis within the upper inner quadrant of the RIGHT breast.
Patient with significant trauma to the upper inner RIGHT breast
approximately 4 months ago.

EXAM:
DIGITAL DIAGNOSTIC RIGHT MAMMOGRAM WITH CAD AND TOMO
ULTRASOUND RIGHT BREAST

[R CC synth-2D]
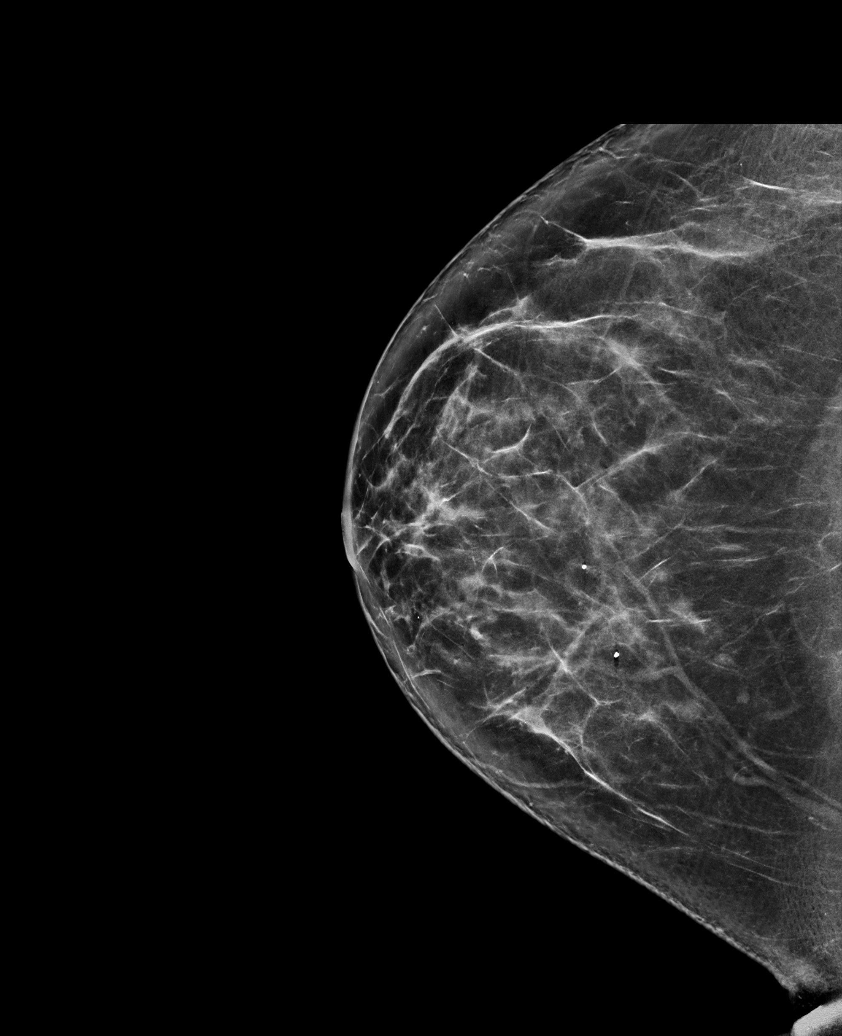

[R MLO synth-2D]
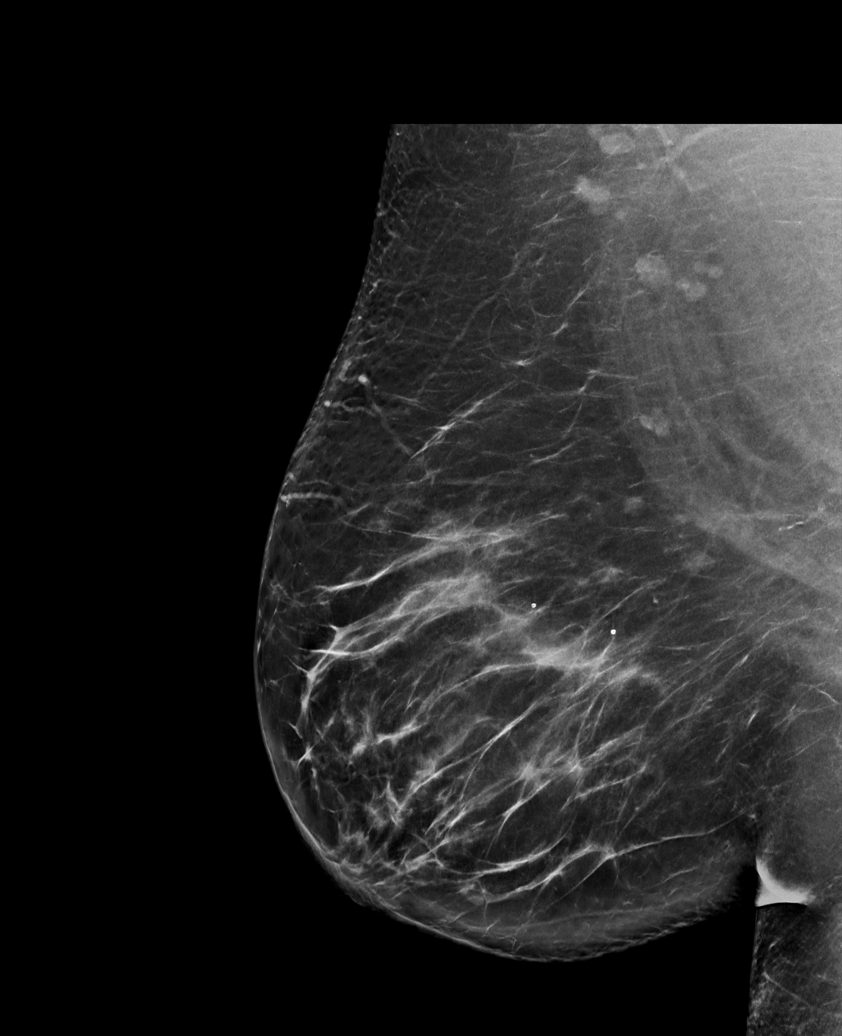

[R CC tomo · 3 of 84 frames shown]
[frame 28/84]
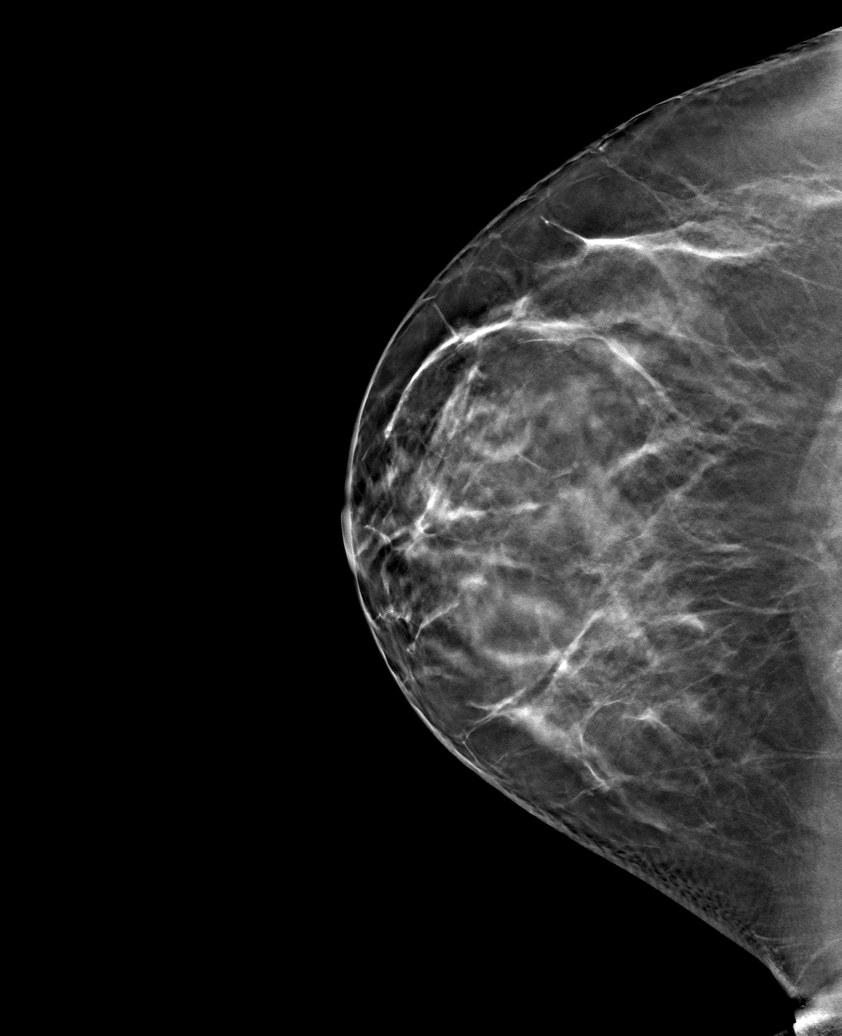
[frame 43/84]
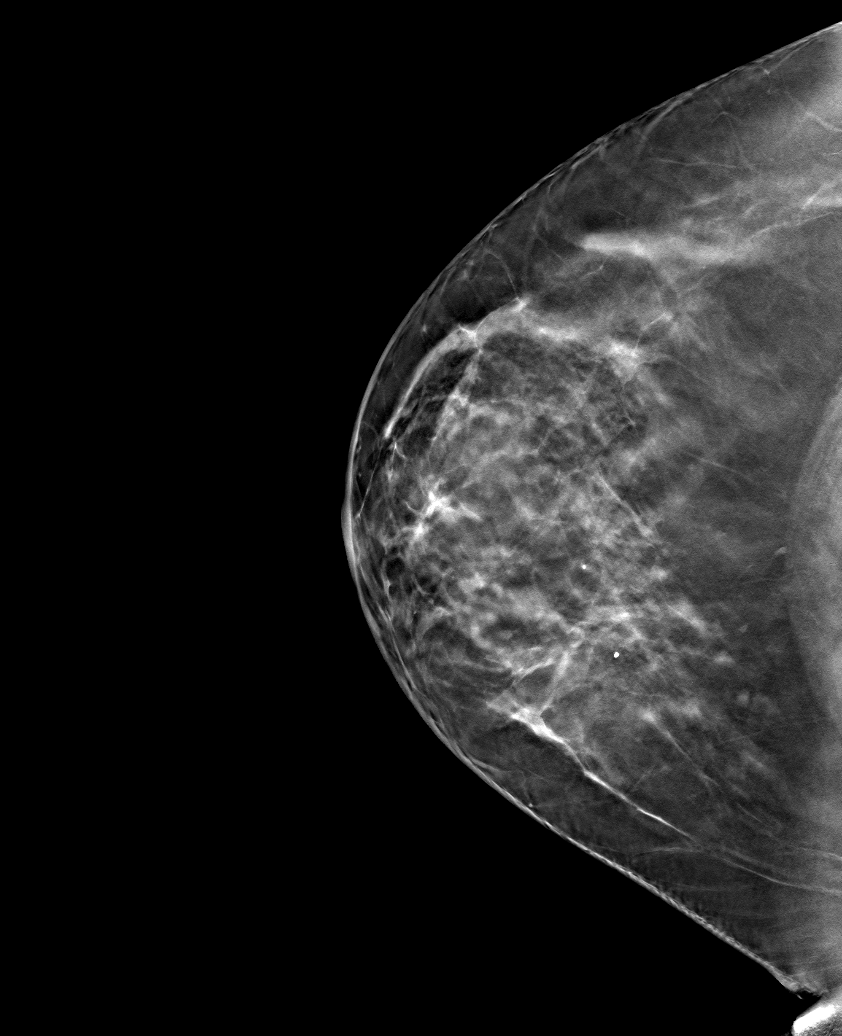
[frame 57/84]
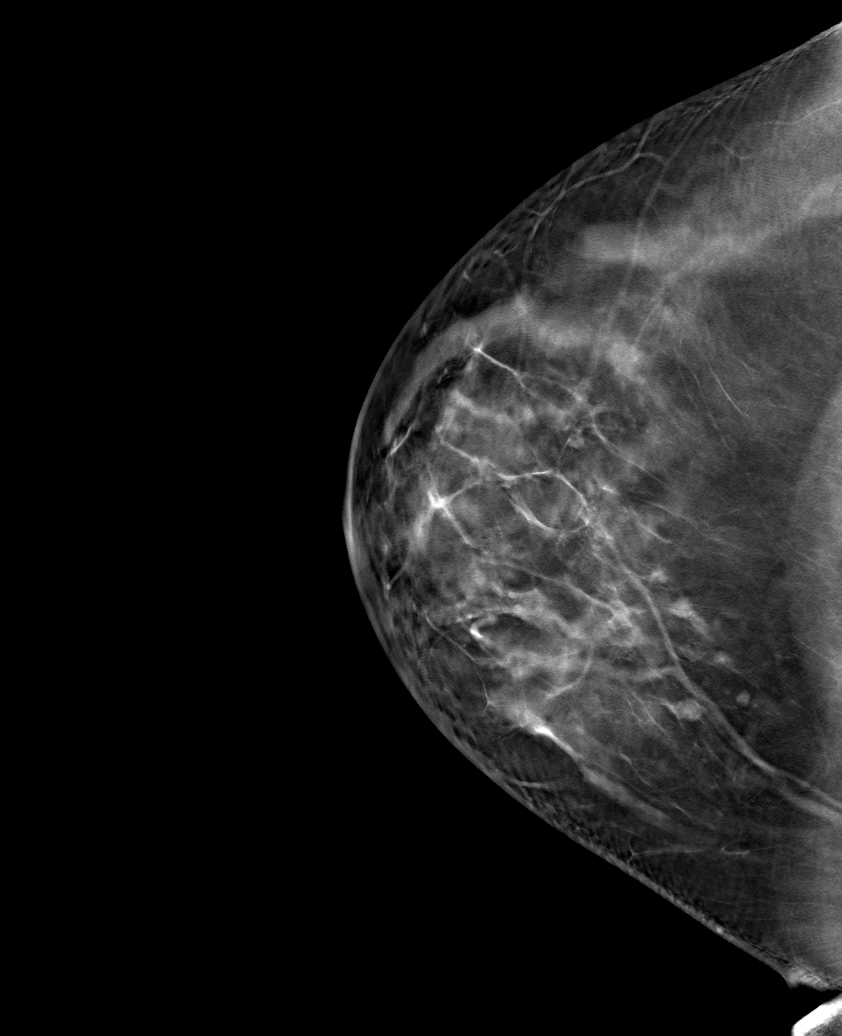

[R MLO tomo · tomo slice 50/99.0]
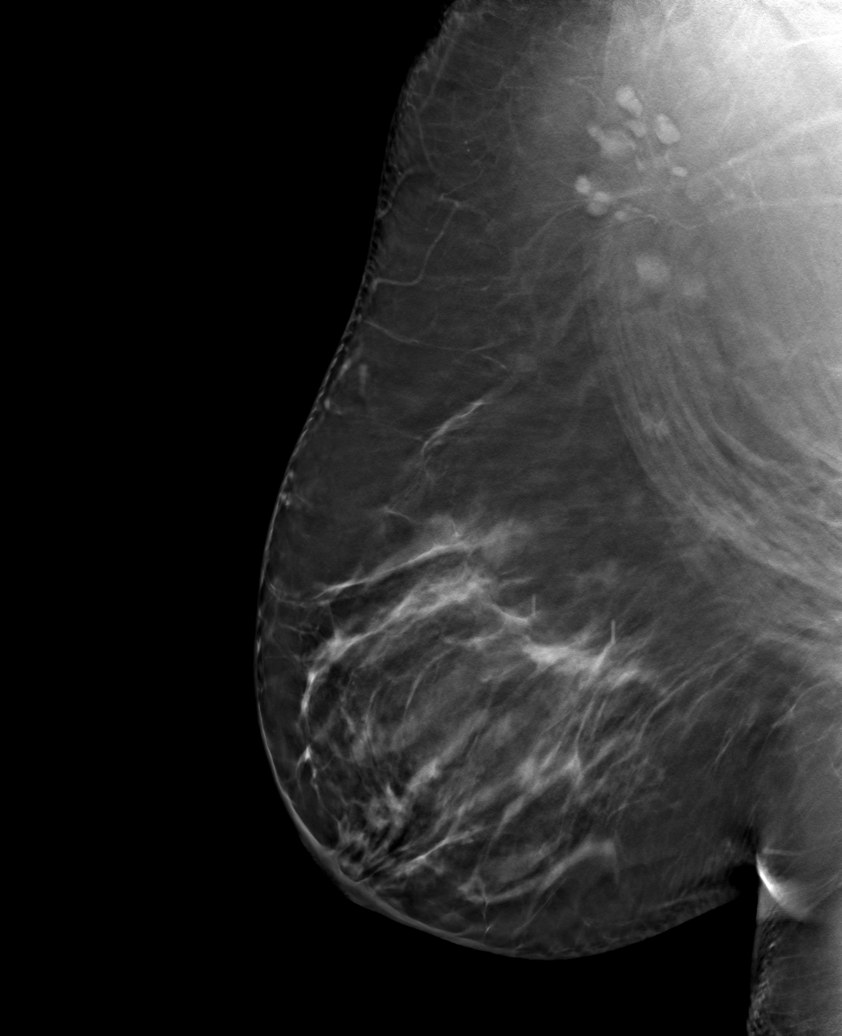

[6 of 12 positions shown; findings below may reference images not displayed]

ACR Breast Density Category c: The breast tissue is heterogeneously
dense, which may obscure small masses.
FINDINGS: The multiple partially obscured masses within the upper inner
quadrant of the RIGHT breast, anterior to posterior depth, are not
significantly changed in the interval, although the 2 largest have
subtle lucencies suggesting internal fat and/or early developing oil
cysts.

There are no new dominant masses, suspicious calcifications or
secondary signs of malignancy within the RIGHT breast.

Mammographic images were processed with CAD.

Targeted ultrasound is performed, showing decreased conspicuity of
the mixed echogenicity areas within the RIGHT breast from the 12 to
1:30 o'clock axes, 10 cm from the nipple, largest components now
measuring 6 mm (previously 8 mm). No new suspicious solid or cystic
masses.
IMPRESSION: Probably benign fat necrosis within the RIGHT breast, extending from
12 to 1:30 o'clock axes, not significantly changed in size by
mammogram (although subtle lucencies are now seen within the
associated masses suggesting internal fat and/or early developing
oil cysts), but less conspicuous and slightly smaller by ultrasound
suggesting benignity. Recommend additional follow-up diagnostic
mammogram and ultrasound in 3 months to ensure continued
improvement/resolution.

RECOMMENDATION:
1. Follow-up RIGHT breast diagnostic mammogram and ultrasound in 3
months to ensure continued improvement/resolution of the suspected
fat necrosis, or evolution confirming benign fat necrosis, in the
upper inner quadrant of the RIGHT breast.
2. Patient understands that if there is no significant change
between now and the next three-month follow-up, ultrasound-guided
biopsy will likely be indicated to ensure benignity.

I have discussed the findings and recommendations with the patient.
If applicable, a reminder letter will be sent to the patient
regarding the next appointment.

BI-RADS CATEGORY  3: Probably benign.

## 2021-09-30 IMAGING — MG MM DIGITAL DIAGNOSTIC UNILAT*R* W/ TOMO W/ CAD
4 series · 6 of 12 positions shown · non-contrast
Comparison: Previous exam(s).

CLINICAL DATA: 43-year-old patient presents for follow-up of
suspected fat necrosis in the upper inner right breast. Patient did
have significant trauma after a fall to the upper inner right breast
in 6969. It was suggested on the last diagnostic report dated
05/15/2019 that if no significant change is seen on today's
evaluation the ultrasound-guided biopsy will likely be indicated.

EXAM:
DIGITAL DIAGNOSTIC RIGHT MAMMOGRAM WITH CAD AND TOMO
ULTRASOUND RIGHT BREAST

[R MLO synth-2D]
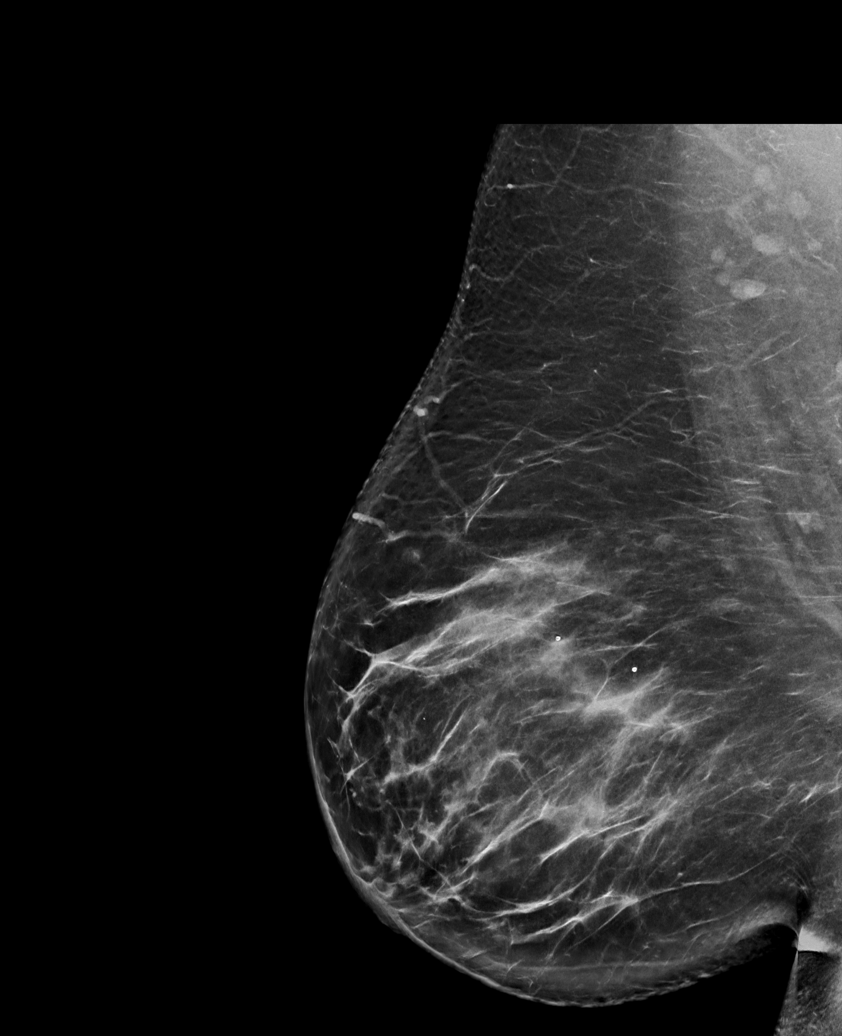

[R CC synth-2D]
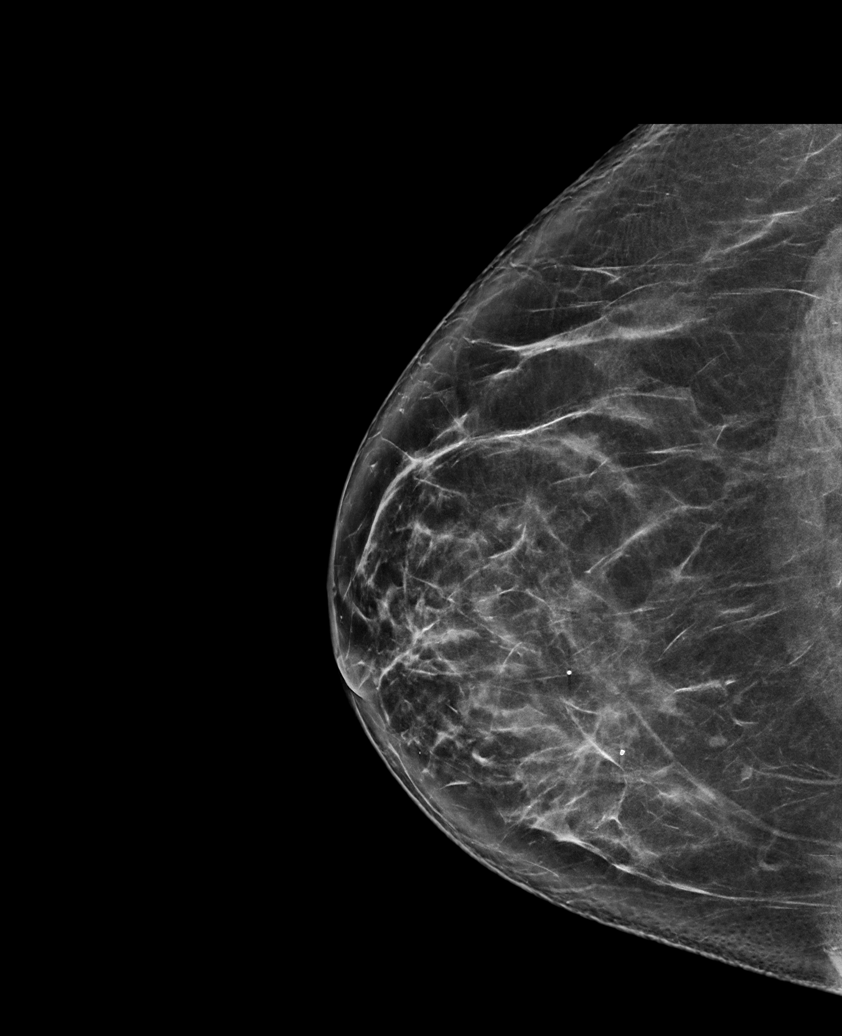

[R MLO tomo · 3 of 98 frames shown]
[frame 32/98]
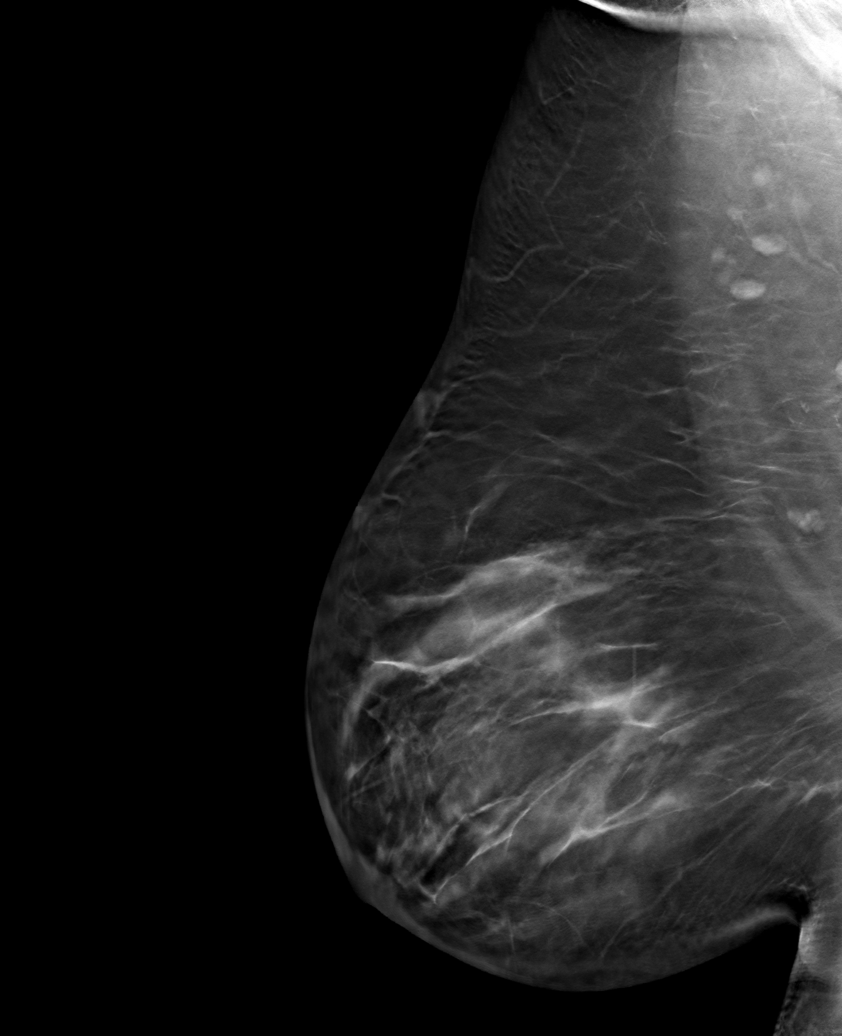
[frame 49/98]
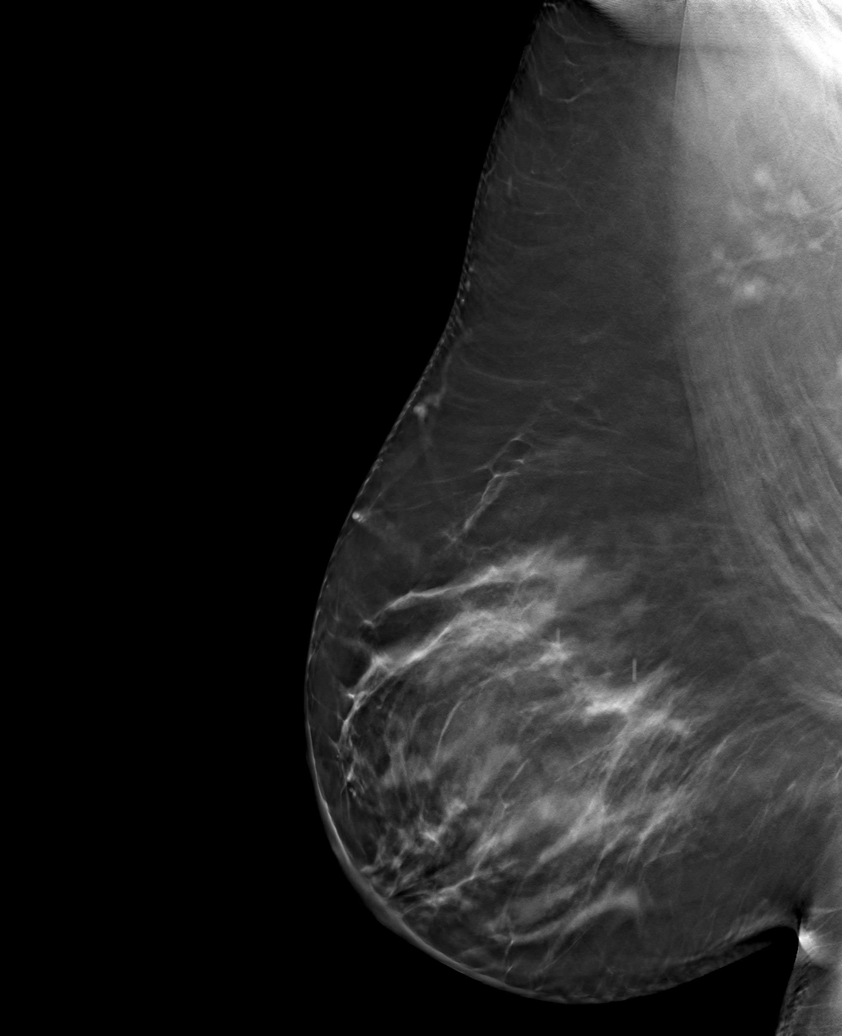
[frame 67/98]
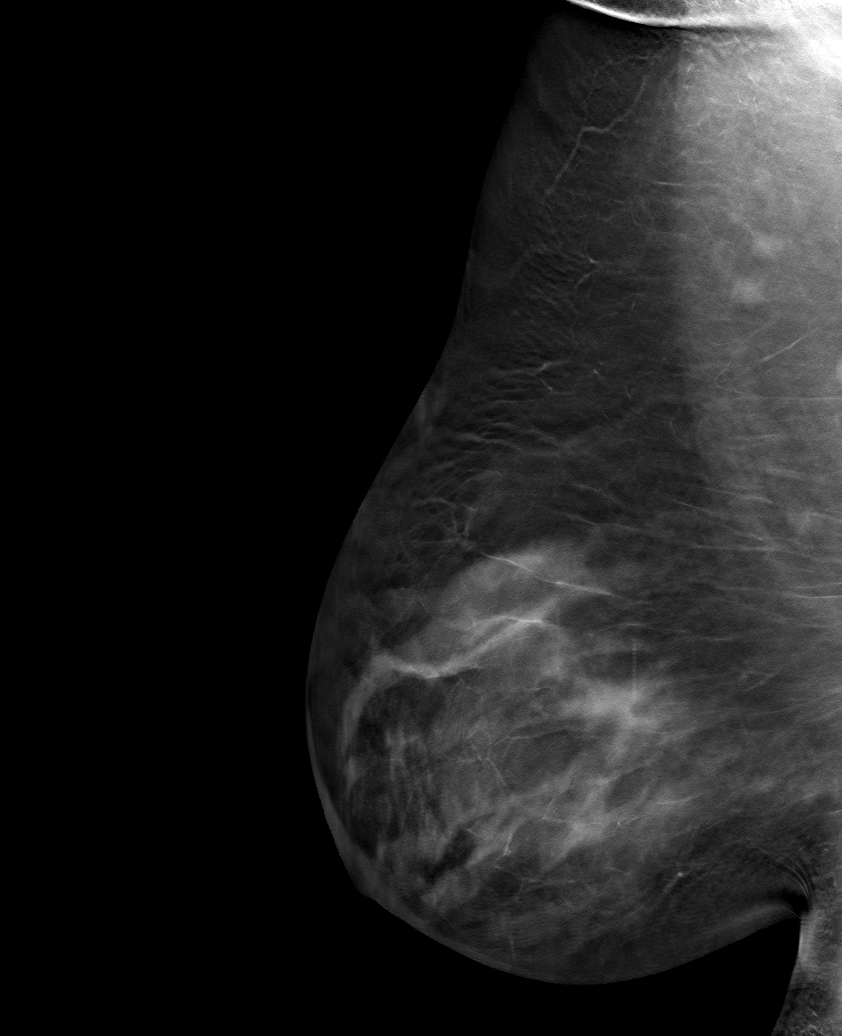

[R CC tomo · tomo slice 44/87.0]
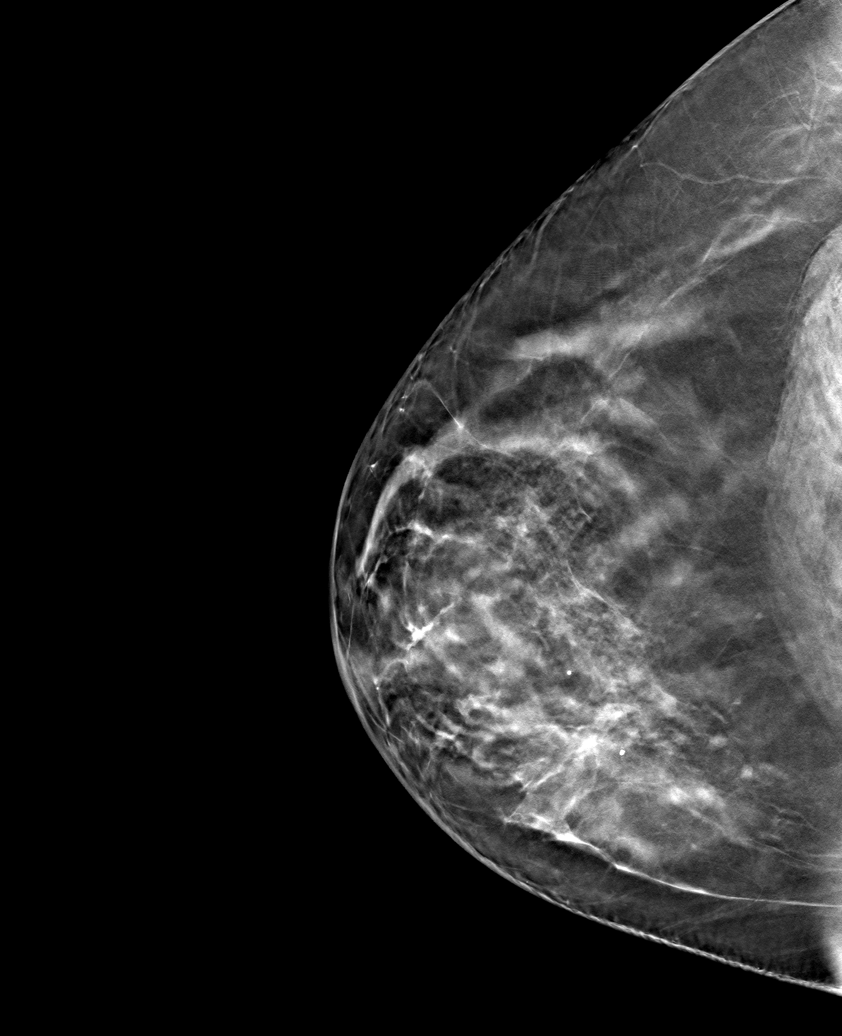

[6 of 12 positions shown; findings below may reference images not displayed]

ACR Breast Density Category c: The breast tissue is heterogeneously
dense, which may obscure small masses.
FINDINGS: Multiple round to oval circumscribed and partially obscured masses
are present in the middle to posterior thirds of the upper inner
right breast. There is suggestion of some fatty components centrally
within some of these masses. No appreciable mammographic change is
seen compared to prior mammograms January 2019 and April 2019.
No suspicious microcalcification or distortion is identified in the
right breast.

Mammographic images were processed with CAD.

Targeted ultrasound is performed, showing multiple similar appearing
mixed echogenicity masses in the right breast in the 12 o'clock to 1
o'clock axis. The deepest, largest, and most hypoechoic masses at
approximately is at 1 o'clock position 10 cm from the nipple
measuring 0.6 x 0.3 x 0.4 cm.

Ultrasound of the right axilla is negative for lymphadenopathy.
IMPRESSION: No appreciable mammographic or sonographic change to multiple masses
in the upper inner quadrant of the right breast. Although fat
necrosis is suspected especially given patient history, given there
has been no significant evolution or decrease over time, tissue
diagnosis is warranted.

RECOMMENDATION:
Ultrasound-guided biopsy of one of the masses in the 12-1 o'clock
axis of the right breast is recommended. Consider biopsying the
largest in deep this mass at 1 o'clock axis approximately 10 cm from
the nipple. Biopsy will be scheduled for the patient.

I have discussed the findings and recommendations with the patient.
If applicable, a reminder letter will be sent to the patient
regarding the next appointment.

BI-RADS CATEGORY  4: Suspicious.

## 2022-03-01 ENCOUNTER — Ambulatory Visit (AMBULATORY_SURGERY_CENTER): Payer: BC Managed Care – PPO

## 2022-03-01 ENCOUNTER — Encounter: Payer: Self-pay | Admitting: Internal Medicine

## 2022-03-01 VITALS — Ht 65.0 in | Wt 260.0 lb

## 2022-03-01 DIAGNOSIS — Z1211 Encounter for screening for malignant neoplasm of colon: Secondary | ICD-10-CM

## 2022-03-01 MED ORDER — NA SULFATE-K SULFATE-MG SULF 17.5-3.13-1.6 GM/177ML PO SOLN: 1.0000 | Freq: Once | ORAL | 0 refills | Status: AC

## 2022-03-01 NOTE — Progress Notes (Signed)

## 2022-03-12 ENCOUNTER — Ambulatory Visit (AMBULATORY_SURGERY_CENTER): Payer: BC Managed Care – PPO | Admitting: Internal Medicine

## 2022-03-12 ENCOUNTER — Encounter: Payer: Self-pay | Admitting: Internal Medicine

## 2022-03-12 VITALS — BP 136/80 | HR 64 | Temp 97.7°F | Resp 14 | Ht 65.0 in | Wt 260.0 lb

## 2022-03-12 DIAGNOSIS — D123 Benign neoplasm of transverse colon: Secondary | ICD-10-CM | POA: Diagnosis not present

## 2022-03-12 DIAGNOSIS — D122 Benign neoplasm of ascending colon: Secondary | ICD-10-CM | POA: Diagnosis not present

## 2022-03-12 DIAGNOSIS — Z1211 Encounter for screening for malignant neoplasm of colon: Secondary | ICD-10-CM | POA: Diagnosis present

## 2022-03-12 DIAGNOSIS — Z1212 Encounter for screening for malignant neoplasm of rectum: Secondary | ICD-10-CM | POA: Diagnosis not present

## 2022-03-12 DIAGNOSIS — D120 Benign neoplasm of cecum: Secondary | ICD-10-CM

## 2022-03-12 MED ORDER — SODIUM CHLORIDE 0.9 % IV SOLN: 500.0000 mL | Freq: Once | INTRAVENOUS | Status: DC

## 2022-03-12 NOTE — Patient Instructions (Signed)
Handouts/ Information given for polyps and hemorrhoids.  YOU HAD AN ENDOSCOPIC PROCEDURE TODAY AT [facility]:   Refer to the procedure report that was given to you for any specific questions about what was found during the examination.  If the procedure report does not answer your questions, please call your gastroenterologist to clarify.  If you requested that your care partner not be given the details of your procedure findings, then the procedure report has been included in a sealed envelope for you to review at your convenience later.  YOU SHOULD EXPECT: Some feelings of bloating in the abdomen. Passage of more gas than usual.  Walking can help get rid of the air that was put into your GI tract during the procedure and reduce the bloating. If you had a lower endoscopy (such as a colonoscopy or flexible sigmoidoscopy) you may notice spotting of blood in your stool or on the toilet paper. If you underwent a bowel prep for your procedure, you may not have a normal bowel movement for a few days.  Please Note:  You might notice some irritation and congestion in your nose or some drainage.  This is from the oxygen used during your procedure.  There is no need for concern and it should clear up in a day or so.  SYMPTOMS TO REPORT IMMEDIATELY:  Following lower endoscopy (colonoscopy):  Excessive amounts of blood in the stool  Significant tenderness or worsening of abdominal pains  Swelling of the abdomen that is new, acute  Fever of 100F or higher  For urgent or emergent issues, a gastroenterologist can be reached at any hour by calling [phone]. Do not use MyChart messaging for urgent concerns.    DIET:  We do recommend a small meal at first, but then you may proceed to your regular diet.  Drink plenty of fluids but you should avoid alcoholic beverages for 24 hours.  ACTIVITY:  You should plan to take it easy for the rest of today and you should NOT DRIVE or use heavy  machinery until tomorrow (because of the sedation medicines used during the test).    FOLLOW UP: Our staff will call the number listed on your records the next business day following your procedure.  We will call around 7:15- 8:00 am to check on you and address any questions or concerns that you may have regarding the information given to you following your procedure. If we do not reach you, we will leave a message.     If any biopsies were taken you will be contacted by phone or by letter within the next 1-3 weeks.  Please call us at [phone] if you have not heard about the biopsies in 3 weeks.    SIGNATURES/CONFIDENTIALITY: You and/or your care partner have signed paperwork which will be entered into your electronic medical record.  These signatures attest to the fact that that the information above on your After Visit Summary has been reviewed and is understood.  Full responsibility of the confidentiality of this discharge information lies with you and/or your care-partner.

## 2022-03-12 NOTE — Progress Notes (Signed)
GASTROENTEROLOGY PROCEDURE H&P NOTE   Primary Care Physician: Practice, [facility]    Reason for Procedure:   Colon cancer screening  Plan:    Colonoscopy  Patient is appropriate for endoscopic procedure(s) in the ambulatory ([place]) setting.  The nature of the procedure, as well as the risks, benefits, and alternatives were carefully and thoroughly reviewed with the patient. Ample time for discussion and questions allowed. The patient understood, was satisfied, and agreed to proceed.     HPI: Wanda Richardson is a 46 y.o. female who presents for colonoscopy for colon cancer screening. Denies blood in stools, changes in bowel habits, or unintentional weight loss. Denies family history of colon cancer.  Past Medical History:  Diagnosis Date   Allergy    Family history of adverse reaction to anesthesia    "mom's hard to wake up after anesthesia" (06/19/2017)   Fibroid    History of COVID-19 02/2018   mild symptoms x 4 to 5 days all symptoms resolved   History of kidney stones    Hyperlipidemia    Hypertension    Ovarian cyst    PONV (postoperative nausea and vomiting)    aftre wisom teeth extraction no problems with 2019 surgery   Wears hearing aid in both ears 03/14/2021    Past Surgical History:  Procedure Laterality Date   ABDOMINAL HYSTERECTOMY N/A 03/21/2021   Procedure: SUPRA CERVICAL ABDOMINAL HYSTERECTOMY;  Surgeon: Everlene Farrier, MD;  Location: [facility];  Service: Gynecology;  Laterality: N/A;   SALPINGOOPHORECTOMY Bilateral 03/21/2021   Procedure: BILATERAL SALPINGO OOPHORECTOMY;  Surgeon: Everlene Farrier, MD;  Location: [facility];  Service: Gynecology;  Laterality: Bilateral;   UMBILICAL HERNIA REPAIR N/A 06/19/2017   Procedure: HERNIA REPAIR UMBILICAL ADULT WITH POSSIBLE MESH;  Surgeon: Ileana Roup, MD;  Location: [facility] [facility];  Service: General;  Laterality: N/A;   [place]    Prior to Admission  medications   Medication Sig Start Date End Date Taking? Authorizing Provider  Atorvastatin Calcium 20 MG/5ML SUSP  02/12/22  Yes [provider]  fluticasone (FLONASE) 50 MCG/ACT nasal spray Place into both nostrils as needed for allergies or rhinitis.   Yes [provider]  losartan (COZAAR) 25 MG tablet Take 25 mg by mouth at bedtime.   Yes [provider]    Current Outpatient Medications  Medication Sig Dispense Refill   Atorvastatin Calcium 20 MG/5ML SUSP      fluticasone (FLONASE) 50 MCG/ACT nasal spray Place into both nostrils as needed for allergies or rhinitis.     losartan (COZAAR) 25 MG tablet Take 25 mg by mouth at bedtime.     Current Facility-Administered Medications  Medication Dose Route Frequency Provider Last Rate Last Admin   0.9 %  sodium chloride infusion  500 mL Intravenous Once Sharyn Creamer, MD        Allergies as of 03/12/2022   (No Known Allergies)    Family History  Problem Relation Age of Onset   Colon polyps Paternal Aunt    Colon cancer Paternal Aunt    Esophageal cancer Neg Hx    Rectal cancer Neg Hx    Stomach cancer Neg Hx     Social History   Socioeconomic History   Marital status: Married    Spouse name: Not on file   Number of children: Not on file   Years of education: Not on file   Highest education level: Not on file  Occupational History   Not on file  Tobacco Use   Smoking status: Never   Smokeless tobacco: Never  Vaping Use   Vaping Use: Never used  Substance and Sexual Activity   Alcohol use: No   Drug use: No   Sexual activity: Not on file  Other Topics Concern   Not on file  Social History Narrative   Not on file   Social Determinants of Health   Financial Resource Strain: Not on file  Food Insecurity: Not on file  Transportation Needs: Not on file  Physical Activity: Not on file  Stress: Not on file  Social Connections: Not on file  Intimate Partner Violence: Not on file     Physical Exam: Vital signs in last 24 hours: BP (!) 147/75   Pulse 75   Temp 97.7 F (36.5 C) (Skin)   Ht '5\' 5"'$  (1.651 m)   Wt 260 lb (117.9 kg)   LMP 03/11/2021   SpO2 98%   BMI 43.27 kg/m  GEN: NAD EYE: Sclerae anicteric ENT: MMM CV: Non-tachycardic Pulm: No increased work of breathing GI: Soft, NT/ND NEURO:  Alert & Oriented   Christia Reading, MD [facility]  03/12/2022 8:34 AM

## 2022-03-12 NOTE — Progress Notes (Signed)
To pacu, VSS. Report to [email or web address] 

## 2022-03-12 NOTE — Progress Notes (Signed)
Called to room to assist during endoscopic procedure.  Patient ID and intended procedure confirmed with present staff. Received instructions for my participation in the procedure from the performing physician.  

## 2022-03-12 NOTE — Op Note (Signed)
[facility] Patient Name: Wanda Richardson Procedure Date: 03/12/2022 8:46 AM MRN: [id number] Endoscopist: Adline Mango Micco , , [id number] Age: 46 Referring MD:  Date of Birth: Jun 17, 1975 Gender: Female Account #: [id number] Procedure:                Colonoscopy Indications:              Screening for colorectal malignant neoplasm, This                            is the patient's first colonoscopy Medicines:                Monitored Anesthesia Care Procedure:                Pre-Anesthesia Assessment:                           - Prior to the procedure, a History and Physical                            was performed, and patient medications and                            allergies were reviewed. The patient's tolerance of                            previous anesthesia was also reviewed. The risks                            and benefits of the procedure and the sedation                            options and risks were discussed with the patient.                            All questions were answered, and informed consent                            was obtained. Prior Anticoagulants: The patient has                            taken no anticoagulant or antiplatelet agents. ASA                            Grade Assessment: III - A patient with severe                            systemic disease. After reviewing the risks and                            benefits, the patient was deemed in satisfactory                            condition to undergo the procedure.  After obtaining informed consent, the colonoscope                            was passed under direct vision. Throughout the                            procedure, the patient's blood pressure, pulse, and                            oxygen saturations were monitored continuously. The                            Olympus CF-HQ190L [id number]) Colonoscope was                            introduced through  the anus and advanced to the the                            terminal ileum. The colonoscopy was performed                            without difficulty. The patient tolerated the                            procedure well. The quality of the bowel                            preparation was good. The terminal ileum, ileocecal                            valve, appendiceal orifice, and rectum were                            photographed. Scope In: 8:48:33 AM Scope Out: 9:06:45 AM Scope Withdrawal Time: 0 hours 11 minutes 51 seconds  Total Procedure Duration: 0 hours 18 minutes 12 seconds  Findings:                 The terminal ileum appeared normal.                           Three sessile polyps were found in the transverse                            colon, ascending colon and cecum. The polyps were 2                            to 5 mm in size. These polyps were removed with a                            cold snare. Resection and retrieval were complete.                           Non-bleeding internal hemorrhoids were found during  retroflexion. Complications:            No immediate complications. Estimated Blood Loss:     Estimated blood loss was minimal. Impression:               - The examined portion of the ileum was normal.                           - Three 2 to 5 mm polyps in the transverse colon,                            in the ascending colon and in the cecum, removed                            with a cold snare. Resected and retrieved.                           - Non-bleeding internal hemorrhoids. Recommendation:           - Discharge patient to home (with escort).                           - Await pathology results.                           - The findings and recommendations were discussed                            with the patient. Dr Georgian Co "[name]" [name],  03/12/2022 9:14:23 AM

## 2022-03-12 NOTE — Progress Notes (Signed)
Pt's states no medical or surgical changes since previsit or office visit. VS assessed by D.T 

## 2022-03-13 ENCOUNTER — Telehealth: Payer: Self-pay

## 2022-03-13 NOTE — Telephone Encounter (Signed)
  Follow up Call-     03/12/2022    7:36 AM  Call back number  Post procedure Call Back phone  # [phone]  Permission to leave phone message Yes     Patient questions:  Do you have a fever, pain , or abdominal swelling? No. Pain Score  0 *  Have you tolerated food without any problems? Yes.    Have you been able to return to your normal activities? Yes.    Do you have any questions about your discharge instructions: Diet   No. Medications  No. Follow up visit  No.  Do you have questions or concerns about your Care? No.  Actions: * If pain score is 4 or above: No action needed, pain <4.

## 2022-03-14 ENCOUNTER — Encounter: Payer: Self-pay | Admitting: Internal Medicine

## 2022-07-13 ENCOUNTER — Telehealth: Payer: BC Managed Care – PPO | Admitting: Family Medicine

## 2022-07-13 DIAGNOSIS — N3000 Acute cystitis without hematuria: Secondary | ICD-10-CM

## 2022-07-13 MED ORDER — CEPHALEXIN 500 MG PO CAPS: 500.0000 mg | ORAL_CAPSULE | Freq: Two times a day (BID) | ORAL | 0 refills | Status: AC

## 2022-07-13 NOTE — Progress Notes (Signed)

## 2023-02-22 ENCOUNTER — Other Ambulatory Visit: Payer: Self-pay | Admitting: Obstetrics and Gynecology

## 2023-02-22 DIAGNOSIS — R928 Other abnormal and inconclusive findings on diagnostic imaging of breast: Secondary | ICD-10-CM

## 2023-03-06 ENCOUNTER — Ambulatory Visit
Admission: RE | Admit: 2023-03-06 | Discharge: 2023-03-06 | Disposition: A | Discharge status: Home / Self Care | Payer: BC Managed Care – PPO | Source: Ambulatory Visit | Attending: Obstetrics and Gynecology | Admitting: Obstetrics and Gynecology

## 2023-03-06 DIAGNOSIS — R928 Other abnormal and inconclusive findings on diagnostic imaging of breast: Secondary | ICD-10-CM

## 2023-03-07 ENCOUNTER — Encounter: Payer: Self-pay | Admitting: Obstetrics and Gynecology

## 2023-03-07 ENCOUNTER — Other Ambulatory Visit: Payer: Self-pay | Admitting: Obstetrics and Gynecology

## 2023-03-07 DIAGNOSIS — R921 Mammographic calcification found on diagnostic imaging of breast: Secondary | ICD-10-CM

## 2023-08-15 ENCOUNTER — Telehealth: Admitting: Nurse Practitioner

## 2023-08-15 DIAGNOSIS — J329 Chronic sinusitis, unspecified: Secondary | ICD-10-CM | POA: Diagnosis not present

## 2023-08-15 DIAGNOSIS — B9789 Other viral agents as the cause of diseases classified elsewhere: Secondary | ICD-10-CM

## 2023-08-15 MED ORDER — IPRATROPIUM BROMIDE 0.03 % NA SOLN: 2.0000 | Freq: Two times a day (BID) | NASAL | 12 refills | Status: AC

## 2023-08-15 NOTE — Progress Notes (Signed)
 E-Visit for Sinus Problems  We are sorry that you are not feeling well.  Here is how we plan to help!  Based on what you have shared with me it looks like you have sinusitis.  Sinusitis is inflammation and infection in the sinus cavities of the head.  Based on your presentation I believe you most likely have Acute Viral Sinusitis.This is an infection most likely caused by a virus. There is not specific treatment for viral sinusitis other than to help you with the symptoms until the infection runs its course.  You may use an oral decongestant such as Mucinex D or if you have glaucoma or high blood pressure use plain Mucinex. Saline nasal spray help and can safely be used as often as needed for congestion, I have prescribed: Ipratropium Bromide  nasal spray 0.03% 2 sprays in eah nostril 2-3 times a day  We do not recommend antibiotics prior to 7-10 days of symptoms  Providers prescribe antibiotics to treat infections caused by bacteria. Antibiotics are very powerful in treating bacterial infections when they are used properly. To maintain their effectiveness, they should be used only when necessary. Overuse of antibiotics has resulted in the development of superbugs that are resistant to treatment!    After careful review of your answers, I would not recommend an antibiotic for your condition.  Antibiotics are not effective against viruses and therefore should not be used to treat them. Common examples of infections caused by viruses include colds and flu   Some authorities believe that zinc sprays or the use of Echinacea may shorten the course of your symptoms.  Sinus infections are not as easily transmitted as other respiratory infection, however we still recommend that you avoid close contact with loved ones, especially the very young and elderly.  Remember to wash your hands thoroughly throughout the day as this is the number one way to prevent the spread of infection!  Home Care: Only take  medications as instructed by your medical team. Do not take these medications with alcohol. A steam or ultrasonic humidifier can help congestion.  You can place a towel over your head and breathe in the steam from hot water coming from a faucet. Avoid close contacts especially the very young and the elderly. Cover your mouth when you cough or sneeze. Always remember to wash your hands.  Get Help Right Away If: You develop worsening fever or sinus pain. You develop a severe head ache or visual changes. Your symptoms persist after you have completed your treatment plan.  Make sure you Understand these instructions. Will watch your condition. Will get help right away if you are not doing well or get worse.   Thank you for choosing an e-visit.  Your e-visit answers were reviewed by a board certified advanced clinical practitioner to complete your personal care plan. Depending upon the condition, your plan could have included both over the counter or prescription medications.  Please review your pharmacy choice. Make sure the pharmacy is open so you can pick up prescription now. If there is a problem, you may contact your provider through [employer] and have the prescription routed to another pharmacy.  Your safety is important to us . If you have drug allergies check your prescription carefully.   For the next 24 hours you can use MyChart to ask questions about today's visit, request a non-urgent call back, or ask for a work or school excuse. You will get an email in the next two days asking about  your experience. I hope that your e-visit has been valuable and will speed your recovery.  I spent approximately 5 minutes reviewing the patient's history, current symptoms and coordinating their care today.

## 2023-09-05 ENCOUNTER — Ambulatory Visit
Admission: RE | Admit: 2023-09-05 | Discharge: 2023-09-05 | Disposition: A | Discharge status: Home / Self Care | Payer: BC Managed Care – PPO | Source: Ambulatory Visit | Attending: Obstetrics and Gynecology | Admitting: Obstetrics and Gynecology

## 2023-09-05 DIAGNOSIS — R921 Mammographic calcification found on diagnostic imaging of breast: Secondary | ICD-10-CM
